# Patient Record
Sex: Female | Born: 1975 | Race: White | Marital: Single | Smoking: Current every day smoker
Health system: Southern US, Community
[De-identification: ages and names within clinical notes are randomized; demographics above are authoritative.]

## PROBLEM LIST (undated history)

## (undated) DIAGNOSIS — N83209 Unspecified ovarian cyst, unspecified side: Secondary | ICD-10-CM

## (undated) DIAGNOSIS — S0500XA Injury of conjunctiva and corneal abrasion without foreign body, unspecified eye, initial encounter: Secondary | ICD-10-CM

## (undated) DIAGNOSIS — I471 Supraventricular tachycardia: Secondary | ICD-10-CM

## (undated) DIAGNOSIS — E282 Polycystic ovarian syndrome: Secondary | ICD-10-CM

## (undated) DIAGNOSIS — I456 Pre-excitation syndrome: Secondary | ICD-10-CM

## (undated) DIAGNOSIS — E8881 Metabolic syndrome: Secondary | ICD-10-CM

## (undated) DIAGNOSIS — R87629 Unspecified abnormal cytological findings in specimens from vagina: Secondary | ICD-10-CM

## (undated) DIAGNOSIS — R87810 Cervical high risk human papillomavirus (HPV) DNA test positive: Principal | ICD-10-CM

## (undated) DIAGNOSIS — H3560 Retinal hemorrhage, unspecified eye: Secondary | ICD-10-CM

## (undated) HISTORY — PX: TONSILLECTOMY: SUR1361

## (undated) HISTORY — PX: DILATION AND CURETTAGE OF UTERUS: SHX78

## (undated) HISTORY — DX: Supraventricular tachycardia: I47.1

## (undated) HISTORY — DX: Cervical high risk human papillomavirus (HPV) DNA test positive: R87.810

## (undated) HISTORY — DX: Injury of conjunctiva and corneal abrasion without foreign body, unspecified eye, initial encounter: S05.00XA

## (undated) HISTORY — PX: LAPAROSCOPY: SHX197

## (undated) HISTORY — PX: RETINAL DETACHMENT SURGERY: SHX105

## (undated) HISTORY — DX: Metabolic syndrome: E88.81

## (undated) HISTORY — DX: Retinal hemorrhage, unspecified eye: H35.60

## (undated) HISTORY — DX: Polycystic ovarian syndrome: E28.2

## (undated) HISTORY — DX: Unspecified abnormal cytological findings in specimens from vagina: R87.629

## (undated) HISTORY — DX: Pre-excitation syndrome: I45.6

## (undated) HISTORY — PX: COLPOSCOPY: SHX161

## (undated) HISTORY — PX: KNEE ARTHROSCOPY: SUR90

---

## 1998-11-11 DIAGNOSIS — I456 Pre-excitation syndrome: Secondary | ICD-10-CM

## 1998-11-11 HISTORY — DX: Pre-excitation syndrome: I45.6

## 2011-05-30 ENCOUNTER — Encounter: Payer: Self-pay | Admitting: Emergency Medicine

## 2011-05-30 ENCOUNTER — Emergency Department (HOSPITAL_COMMUNITY): Payer: Self-pay

## 2011-05-30 ENCOUNTER — Emergency Department (HOSPITAL_COMMUNITY)
Admission: EM | Admit: 2011-05-30 | Discharge: 2011-05-31 | Disposition: A | Discharge status: Home / Self Care | Payer: Self-pay | Attending: Emergency Medicine | Admitting: Emergency Medicine

## 2011-05-30 DIAGNOSIS — F17200 Nicotine dependence, unspecified, uncomplicated: Secondary | ICD-10-CM | POA: Insufficient documentation

## 2011-05-30 DIAGNOSIS — O200 Threatened abortion: Secondary | ICD-10-CM | POA: Insufficient documentation

## 2011-05-30 HISTORY — DX: Unspecified ovarian cyst, unspecified side: N83.209

## 2011-05-30 LAB — CBC
Hemoglobin: 12.6 g/dL (ref 12.0–15.0)
MCHC: 34.8 g/dL (ref 30.0–36.0)
WBC: 6.2 10*3/uL (ref 4.0–10.5)

## 2011-05-30 LAB — DIFFERENTIAL
Basophils Absolute: 0 10*3/uL (ref 0.0–0.1)
Basophils Relative: 0 % (ref 0–1)
Monocytes Relative: 6 % (ref 3–12)
Neutro Abs: 3.9 10*3/uL (ref 1.7–7.7)
Neutrophils Relative %: 63 % (ref 43–77)

## 2011-05-30 LAB — URINE MICROSCOPIC-ADD ON

## 2011-05-30 LAB — BASIC METABOLIC PANEL
BUN: 6 mg/dL (ref 6–23)
Chloride: 104 mEq/L (ref 96–112)
GFR calc Af Amer: >60 mL/min (ref >60)
Potassium: 3.6 mEq/L (ref 3.5–5.1)

## 2011-05-30 LAB — URINALYSIS, ROUTINE W REFLEX MICROSCOPIC
Glucose, UA: NEGATIVE mg/dL
Leukocytes, UA: NEGATIVE
Specific Gravity, Urine: >1.03 — ABNORMAL HIGH (ref 1.005–1.030)
pH: 5.5 (ref 5.0–8.0)

## 2011-05-30 LAB — PREGNANCY, URINE: Preg Test, Ur: POSITIVE

## 2011-05-30 LAB — ABO/RH: ABO/RH(D): A POS

## 2011-05-30 MED ORDER — MORPHINE SULFATE 10 MG/ML IJ SOLN
4.0000 mg | Freq: Once | INTRAMUSCULAR | Status: AC
  Administered 2011-05-30: 4 mg via INTRAMUSCULAR
  Filled 2011-05-30: qty 1

## 2011-05-30 MED ORDER — ONDANSETRON 8 MG PO TBDP
8.0000 mg | ORAL_TABLET | Freq: Once | ORAL | Status: AC
  Administered 2011-05-30: 8 mg via ORAL
  Filled 2011-05-30: qty 1

## 2011-05-30 NOTE — ED Provider Notes (Signed)
History     Chief Complaint  Patient presents with  . Vaginal Bleeding  . Abdominal Pain   HPI  Past Medical History  Diagnosis Date  . Endometriosis   . Other and unspecified ovarian cysts     Past Surgical History  Procedure Date  . Tonsillectomy   . Dilation and curettage of uterus   . Knee arthroscopy     History reviewed. No pertinent family history.  History  Substance Use Topics  . Smoking status: Current Everyday Smoker -- 1.0 packs/day  . Smokeless tobacco: Not on file  . Alcohol Use: 1.8 oz/week    3 Cans of beer per week    OB History    Grav Para Term Preterm Abortions TAB SAB Ect Mult Living   4 0              Review of Systems  Physical Exam  BP 107/53  Pulse 66  Temp(Src) 98.2 F (36.8 C) (Oral)  Resp 16  Ht 5\' 10"  (1.778 m)  Wt 165 lb (74.844 kg)  BMI 23.68 kg/m2  SpO2 99%  LMP 05/28/2011  Physical Exam  ED Course  Procedures  Medical screening examination/treatment/procedure(s) were performed by non-physician practitioner and as supervising physician I was immediately available for consultation/collaboration.       Nelia Shi, MD 05/30/11 [phone]

## 2011-05-30 NOTE — ED Notes (Signed)
Pt states she took a pregnancy test about one month ago and it was positive. Pt states she started bleeding Tuesday night and started cramping last night.

## 2011-05-30 NOTE — ED Provider Notes (Signed)
History     Chief Complaint  Patient presents with  . Vaginal Bleeding  . Abdominal Pain   HPI Comments: Patient c/o persistent vaginal bleeding for two days and pelvic cramping that began last evening.  States that she took several home pregnancy tests 2 weeks ago and all have been positive.  States that she is concerned that she is having a miscarriage.  States she has a hx of endometriosis and previous miscarriages that have had similar symptoms.  She denies previous ectopic pregnancies, fever, vomiting or chest pain. States the bleeding tonight has been similar to her periods but with frequent clots.     Patient is a 35 y.o. female presenting with vaginal bleeding. The history is provided by the patient.  Vaginal Bleeding This is a new problem. The current episode started in the past 7 days. The problem occurs constantly. The problem has been unchanged. Associated symptoms include abdominal pain and nausea. Pertinent negatives include no change in bowel habit, chest pain, fatigue, fever, headaches, neck pain, numbness, sore throat, urinary symptoms, vomiting or weakness. The symptoms are aggravated by nothing. She has tried nothing for the symptoms. The treatment provided no relief.    Past Medical History  Diagnosis Date  . Endometriosis   . Other and unspecified ovarian cysts     Past Surgical History  Procedure Date  . Tonsillectomy   . Dilation and curettage of uterus   . Knee arthroscopy     History reviewed. No pertinent family history.  History  Substance Use Topics  . Smoking status: Current Everyday Smoker -- 1.0 packs/day  . Smokeless tobacco: Not on file  . Alcohol Use: 1.8 oz/week    3 Cans of beer per week    OB History    Grav Para Term Preterm Abortions TAB SAB Ect Mult Living   4 0              Review of Systems  Constitutional: Negative for fever, activity change, appetite change and fatigue.  HENT: Negative for sore throat, neck pain and neck  stiffness.   Respiratory: Negative.   Cardiovascular: Negative.  Negative for chest pain.  Gastrointestinal: Positive for nausea and abdominal pain. Negative for vomiting, diarrhea, constipation, abdominal distention and change in bowel habit.  Genitourinary: Positive for vaginal bleeding, menstrual problem and pelvic pain. Negative for dysuria, flank pain and vaginal discharge.  Musculoskeletal: Negative.   Skin: Negative.   Neurological: Negative for weakness, numbness and headaches.  Hematological: Does not bruise/bleed easily.  Psychiatric/Behavioral: Negative for behavioral problems.    Physical Exam  BP 111/59  Pulse 64  Temp(Src) 98.2 F (36.8 C) (Oral)  Resp 20  Ht 5\' 10"  (1.778 m)  Wt 165 lb (74.844 kg)  BMI 23.68 kg/m2  SpO2 99%  LMP 05/28/2011  Physical Exam  Constitutional: She is oriented to person, place, and time. She appears well-developed and well-nourished.  Non-toxic appearance. No distress.  HENT:  Head: Normocephalic and atraumatic.  Eyes: Pupils are equal, round, and reactive to light.  Neck: Normal range of motion. Neck supple.  Cardiovascular: Normal rate, regular rhythm and normal heart sounds.   Pulmonary/Chest: Effort normal and breath sounds normal.  Abdominal: Soft. She exhibits no mass. There is tenderness. There is no rebound and no guarding.  Genitourinary: Vagina normal. Uterus is tender. Uterus is not enlarged. Right adnexum displays no mass and no tenderness. Left adnexum displays no mass and no tenderness. No vaginal discharge found.  Cervix is closed,  Blood present in the canal with few small clots.  Uterus is not enlarged.  Musculoskeletal: Normal range of motion.  Neurological: She is alert and oriented to person, place, and time. She exhibits normal muscle tone. Coordination normal.  Skin: Skin is warm and dry.    ED Course  Procedures  MDM   2320  Patient back from [place].  Vitals remain stable.  Likely fetal demise.  I have  discussed pt hx, results and care plan with EDP and discussed results with the pt.  Has hx of previous miscarriages, endometriosis.  I have recommended close f/u with Dr. Despina Hidden or [facility] for serial quant. HCG levels and repeat US in 7-10 days.  Patient verbalized understanding of her results and importance of close follow-up.  She was also advised to return here if needed.  Results for orders placed during the hospital encounter of 05/30/11  URINALYSIS, ROUTINE W REFLEX MICROSCOPIC      Component Value Range   Color, Urine AMBER (*) YELLOW    Appearance CLEAR  CLEAR    Specific Gravity, Urine >1.030 (*) 1.005 - 1.030    pH 5.5  5.0 - 8.0    Glucose, UA NEGATIVE  NEGATIVE (mg/dL)   Hgb urine dipstick LARGE (*) NEGATIVE    Bilirubin Urine SMALL (*) NEGATIVE    Ketones, ur NEGATIVE  NEGATIVE (mg/dL)   Protein, ur TRACE (*) NEGATIVE (mg/dL)   Urobilinogen, UA 0.2  0.0 - 1.0 (mg/dL)   Nitrite NEGATIVE  NEGATIVE    Leukocytes, UA NEGATIVE  NEGATIVE   PREGNANCY, URINE      Component Value Range   Preg Test, Ur POSITIVE    CBC      Component Value Range   WBC 6.2  4.0 - 10.5 (K/uL)   RBC 3.99  3.87 - 5.11 (MIL/uL)   Hemoglobin 12.6  12.0 - 15.0 (g/dL)   HCT 21.3  08.6 - 57.8 (%)   MCV 90.7  78.0 - 100.0 (fL)   MCH 31.6  26.0 - 34.0 (pg)   MCHC 34.8  30.0 - 36.0 (g/dL)   RDW 46.9  62.9 - 52.8 (%)   Platelets 220  150 - 400 (K/uL)  DIFFERENTIAL      Component Value Range   Neutrophils Relative 63  43 - 77 (%)   Neutro Abs 3.9  1.7 - 7.7 (K/uL)   Lymphocytes Relative 29  12 - 46 (%)   Lymphs Abs 1.8  0.7 - 4.0 (K/uL)   Monocytes Relative 6  3 - 12 (%)   Monocytes Absolute 0.4  0.1 - 1.0 (K/uL)   Eosinophils Relative 1  0 - 5 (%)   Eosinophils Absolute 0.1  0.0 - 0.7 (K/uL)   Basophils Relative 0  0 - 1 (%)   Basophils Absolute 0.0  0.0 - 0.1 (K/uL)  BASIC METABOLIC PANEL      Component Value Range   Sodium 138  135 - 145 (mEq/L)   Potassium 3.6  3.5 - 5.1 (mEq/L)    Chloride 104  96 - 112 (mEq/L)   CO2 24  19 - 32 (mEq/L)   Glucose, Bld 129 (*) 70 - 99 (mg/dL)   BUN 6  6 - 23 (mg/dL)   Creatinine, Ser 4.13  0.50 - 1.10 (mg/dL)   Calcium 9.5  8.4 - 24.4 (mg/dL)   GFR calc non Af Amer >60  >60 (mL/min)   GFR calc Af Amer >60  >60 (mL/min)  URINE MICROSCOPIC-ADD ON      Component Value Range   Squamous Epithelial / LPF FEW (*) RARE    WBC, UA 0-2  <3 (WBC/hpf)   RBC / HPF 11-20  <3 (RBC/hpf)   Bacteria, UA MANY (*) RARE   HCG, QUANTITATIVE, PREGNANCY      Component Value Range   hCG, Beta Chain, Quant, S 3585 (*) <5 (mIU/mL)  WET PREP, GENITAL      Component Value Range   Yeast, Wet Prep NONE SEEN  NONE SEEN    Trich, Wet Prep NONE SEEN  NONE SEEN    Clue Cells, Wet Prep FEW (*) NONE SEEN    WBC, Wet Prep HPF POC FEW (*) NONE SEEN   ABO/RH      Component Value Range   ABO/RH(D) A POS      US Ob Comp Less 14 Wks  05/30/2011  ADDENDUM CREATED: 05/30/2011 23:17:56  Discussion with Navon Kotowski Triplet 05/30/2011 11:20 p.m. with regard to follow-up.   END ADDENDUM  SIGNED BY: Almedia Balls. Constance Goltz, M.D.   05/30/2011 RADIOLOGY REPORT  Clinical Data: Pelvic pain.  OBSTETRIC <14 WK [place] AND TRANSVAGINAL OB US  Technique:  Both transabdominal and transvaginal ultrasound examinations were performed for complete evaluation of the gestation as well as the maternal uterus, adnexal regions, and pelvic cul-de-sac.  Transvaginal technique was performed to assess early pregnancy.  Comparison:  None.  Gestational sac is elongated.  Mean sac diameter at 2.49 cm suggestive of 7-week-4-day gestational age with estimated date of confinement of 01/12/2012.  Within the elongated gestational sac is an abnormal appearing structure which does not have the typical appearance for a yolk sac and normal embryo.  No heart rate is noted.  Maternal uterus/adnexae: Trace amount of fluid in the cervix.  IMPRESSION: Abnormal appearance of the contents of the gestational sac.  Favor correlation with  clinical exam, serial beta HCGs and follow-up sonogram in 10 days.  Original Report Authenticated By: Fuller Canada, M.D.   US Transvaginal Non-ob  05/30/2011  ADDENDUM  CREATED: 05/30/2011 23:17:56  Discussion with Denaya Horn Triplet 05/30/2011 11:20 p.m. with regard to follow-up.  END ADDENDUM  SIGNED BY: Almedia Balls. Constance Goltz, M.D.   05/30/2011  RADIOLOGY REPORT  Clinical Data: Pelvic pain.  OBSTETRIC <14 WK [place] AND TRANSVAGINAL OB US  Technique:  Both transabdominal and transvaginal ultrasound examinations were performed for complete evaluation of the gestation as well as the maternal uterus, adnexal regions, and pelvic cul-de-sac.  Transvaginal technique was performed to assess early pregnancy.  Comparison:  None.  Gestational sac is elongated.  Mean sac diameter at 2.49 cm suggestive of 7-week-4-day gestational age with estimated date of confinement of 01/12/2012.  Within the elongated gestational sac is an abnormal appearing structure which does not have the typical appearance for a yolk sac and normal embryo.  No heart rate is noted.  Maternal uterus/adnexae: Trace amount of fluid in the cervix.  IMPRESSION: Abnormal appearance of the contents of the gestational sac.  Favor correlation with clinical exam, serial beta HCGs and follow-up sonogram in 10 days.  Original Report Authenticated By: Fuller Canada, M.D.        Jarrell Armond L. Robertson, [place] 05/31/11 [phone]

## 2011-05-31 MED ORDER — HYDROCODONE-ACETAMINOPHEN 5-325 MG PO TABS: ORAL_TABLET | ORAL | Status: AC

## 2011-05-31 NOTE — ED Notes (Signed)
Patient is calm just relaxing waiting for discharge.

## 2011-06-01 LAB — GC/CHLAMYDIA PROBE AMP, GENITAL: Chlamydia, DNA Probe: NEGATIVE

## 2011-06-09 NOTE — ED Provider Notes (Signed)
Medical screening examination/treatment/procedure(s) were performed by non-physician practitioner and as supervising physician I was immediately available for consultation/collaboration.   Nelia Shi, MD 06/09/11 [phone]

## 2014-09-12 ENCOUNTER — Encounter: Payer: Self-pay | Admitting: Emergency Medicine

## 2016-11-07 ENCOUNTER — Ambulatory Visit (INDEPENDENT_AMBULATORY_CARE_PROVIDER_SITE_OTHER): Payer: Self-pay | Admitting: Internal Medicine

## 2016-11-07 ENCOUNTER — Encounter: Payer: Self-pay | Admitting: Internal Medicine

## 2016-11-07 VITALS — BP 106/62 | HR 69 | Ht 68.0 in | Wt 149.0 lb

## 2016-11-07 DIAGNOSIS — R000 Tachycardia, unspecified: Secondary | ICD-10-CM

## 2016-11-07 DIAGNOSIS — I456 Pre-excitation syndrome: Secondary | ICD-10-CM

## 2016-11-07 DIAGNOSIS — R55 Syncope and collapse: Secondary | ICD-10-CM

## 2016-11-07 NOTE — Patient Instructions (Signed)
Your physician recommends that you schedule a follow-up appointment To be determined  Your physician recommends that you continue on your current medications as directed. Please refer to the Current Medication list given to you today.  Your physician has recommended that you wear an 30 Day event monitor. Event monitors are medical devices that record the heart's electrical activity. Doctors most often us these monitors to diagnose arrhythmias. Arrhythmias are problems with the speed or rhythm of the heartbeat. The monitor is a small, portable device. You can wear one while you do your normal daily activities. This is usually used to diagnose what is causing palpitations/syncope (passing out).  If you need a refill on your cardiac medications before your next appointment, please call your pharmacy.  Thank you for choosing [facility]!

## 2016-11-07 NOTE — Progress Notes (Signed)
Cardiology Office Note   Date:  11/07/2016   ID:  Laura Morales Kok, DOB [birth year], MRN [id number]  PCP:  Wilson SingerGOSRANI,NIMISH C, MD  Cardiologist:   Dietrich PatesPaula Derec Mozingo, MD    Referred for syncope  ? WPW     History of Present Illness: Laura Morales Duty is a 40 y.o. female with a history of Pt is seen by [place].  Pt was sitting at home  Finished morning classes  Was around 3 PM  Had oatmal and 2 granola bars at [employer]  Taught classes  Feels like sugar wsa low  Felt heart racing some but was excited about package      Then passed out   Sudden LOC  Lasted about 20 min  When woke L arm wa numb and shoulder hurt and L face hurt     Confusion after for 3 days  Unable to veralized what wanted to say  Dizziness with movement  WOrld spinning   Aoso CP Dx with WPW at age 40    Now pain on L chest   Has skips on L chest often  Last about 16 beats then stops NOw have heart racing     Arm goes numb  Then pressure  In L neck  Nauseated   No syncope  Feels headache after       Current Meds  Medication Sig  . ibuprofen (ADVIL,MOTRIN) 200 MG tablet Take 200 mg by mouth every 6 (six) hours as needed.       Allergies:   Codeine   Past Medical History:  Diagnosis Date  . Endometriosis   . Other and unspecified ovarian cysts   . WPW (Wolff-Parkinson-White syndrome) 2000    Past Surgical History:  Procedure Laterality Date  . DILATION AND CURETTAGE OF UTERUS    . KNEE ARTHROSCOPY    . LAPAROSCOPY    . TONSILLECTOMY       Social History:  The patient  reports that she has been smoking Cigarettes.  She started smoking about 27 years ago. She has been smoking about 0.50 packs per day. She has quit using smokeless tobacco. She reports that she drinks about 1.8 oz of alcohol per week . She reports that she does not use drugs.   Family History:  The patient's family history includes Cystic fibrosis in her sister; Diabetes in her father; Epilepsy in her mother; Glaucoma in her brother and father;  Heart failure in her maternal grandmother; Hypotension in her mother; Multiple sclerosis in her sister; Seizures in her sister and sister.  Mother with tachycardia  Not sure what     ROS:  Please see the history of present illness. All other systems are reviewed and  Negative to the above problem except as noted.    PHYSICAL EXAM: VS:  BP 106/62 (BP Location: Right Arm)   Pulse 69   Ht 5\' 8"  (1.727 m)   Wt 149 lb (67.6 kg)   LMP 10/17/2016   SpO2 99%   BMI 22.66 kg/m   GEN: Well nourished, well developed, in no acute distress  HEENT: normal  Neck: no JVD, carotid bruits, or masses Cardiac: RRR; no murmurs, rubs, or gallops,no edema  Respiratory:  clear to auscultation bilaterally, normal work of breathing GI: soft, nontender, nondistended, + BS  No hepatomegaly  MS: no deformity Moving all extremities   Skin: warm and dry, no rash Neuro:  Strength and sensation are intact Psych: euthymic mood, full affect   EKG:  EKG is ordered  today.  SB 58 bpm  Short PR interval     Lipid Panel No results found for: CHOL, TRIG, HDL, CHOLHDL, VLDL, LDLCALC, LDLDIRECT    Wt Readings from Last 3 Encounters:  11/07/16 149 lb (67.6 kg)  05/30/11 165 lb (74.8 kg)      ASSESSMENT AND PLAN:  1  Syncope  I am not convinced syncopal spelll was related to an arrhythmia.  EKG with short PR but no definite preexcitation.   She does have spells of palpitations, some sound like short bursts of tachycardia or just skipped beats I have recomm 1.  Pt get set up for an event monitor  2.  Stay hydrated and eat when working out 3  No driving for 6 months unless identifiable cause found  The pt having L sided aches (chest , jaw, neck) and some numbness in hand  I think this most likely is muscular from falling back on L side     Current medicines are reviewed at length with the patient today.  The patient does not have concerns regarding medicines.  Signed, Dietrich PatesPaula Cynthie Garmon, MD  11/07/2016 8:58 AM      [facility] [address], [place], [place]  [address] Phone: [phone]; Fax: [phone]

## 2016-11-13 ENCOUNTER — Ambulatory Visit (INDEPENDENT_AMBULATORY_CARE_PROVIDER_SITE_OTHER): Payer: Self-pay

## 2016-11-13 DIAGNOSIS — R55 Syncope and collapse: Secondary | ICD-10-CM

## 2016-11-13 DIAGNOSIS — R000 Tachycardia, unspecified: Secondary | ICD-10-CM

## 2016-12-04 ENCOUNTER — Telehealth: Payer: Self-pay | Admitting: Cardiovascular Disease

## 2016-12-04 NOTE — Telephone Encounter (Signed)
Returned pt phone call, she complains of being light headed and dizzy for past several days. She has kept up with he bp ( 113/70, 106/65, 93/66, 121/62, 97/51)  She has felt her heart flutter and skip beats. She also stated that she is having left arm numbness. She has been drinking plenty of water and eating on a regular basis to try to keep her bp from dropping too low. Please advise.

## 2016-12-04 NOTE — Telephone Encounter (Signed)
Please call patient regarding BP readings / tg  °

## 2016-12-05 NOTE — Telephone Encounter (Signed)
[id number] 

## 2016-12-05 NOTE — Telephone Encounter (Signed)
Pt will come on 2/8 at 4 pm for orthostatics

## 2016-12-05 NOTE — Telephone Encounter (Signed)
Patient needs to increase fluid and salt. Please place on f/u for orthostatics again in a couple wks

## 2016-12-19 ENCOUNTER — Encounter: Payer: Self-pay | Admitting: Internal Medicine

## 2016-12-19 ENCOUNTER — Ambulatory Visit (INDEPENDENT_AMBULATORY_CARE_PROVIDER_SITE_OTHER): Payer: Self-pay

## 2016-12-19 VITALS — BP 121/66 | Ht 70.0 in | Wt 154.0 lb

## 2016-12-19 DIAGNOSIS — R42 Dizziness and giddiness: Secondary | ICD-10-CM

## 2016-12-19 NOTE — Progress Notes (Signed)
Pt came in and states that she is very tired, sob, dizziness, chest pain and tightness, lots of pressure and also feels like a pinching sensation in middle of chest( not at any particular time of day ) Pt having bad headaches also.

## 2016-12-19 NOTE — Patient Instructions (Addendum)
Your physician recommends that you continue on your current medications as directed. Please refer to the Current Medication list given to you today.     Your physician recommends that you schedule a follow-up appointment in: TO BE DETERMINED  Thanks for choosing [facility]!!!

## 2016-12-20 ENCOUNTER — Encounter: Payer: Self-pay | Admitting: Internal Medicine

## 2016-12-20 ENCOUNTER — Telehealth: Payer: Self-pay | Admitting: Internal Medicine

## 2016-12-20 NOTE — Telephone Encounter (Signed)
Patient is calling saying that someone named Lynden AngCathy called her and left a message for her to call back for results. It looks like a Marlyn Corporalatherine Carlton, RN from the [place] office called and left a message for her event monitor. Patient was made aware of results.   "Notes Recorded by Pricilla RifflePaula Ross V, MD on 12/20/2016 at 3:36 PM EST Sinus rhythm One short burst of SVT Sensed. Other symptoms however did not correlate with arrhythmia  Will review with EP Keep hydrated."  The patient is requesting an note for work. I told the patient that I would send the message to Dr. Tenny Crawoss and Marlyn Corporalatherine Carlton, RN.

## 2016-12-20 NOTE — Telephone Encounter (Signed)
New message      Needs her results and note for her to return to work to work on Monday ,

## 2016-12-23 NOTE — Telephone Encounter (Signed)
Seen 11/07/17 , will forward to Dr Tenny Crawoss regarding work slip

## 2016-12-24 NOTE — Telephone Encounter (Signed)
Please write for excuse for work until she is seen She should have appt soon for BP check (orthostatics)

## 2016-12-25 NOTE — Telephone Encounter (Signed)
Forwarded a note to MGM MIRAGEerry Goins to schedule an appointment with Joni ReiningKathryn Lawrence, as pt has questions that cannot be answered at a nurse visit.

## 2016-12-30 ENCOUNTER — Ambulatory Visit (INDEPENDENT_AMBULATORY_CARE_PROVIDER_SITE_OTHER): Payer: Self-pay | Admitting: Adult Health

## 2016-12-30 ENCOUNTER — Encounter: Payer: Self-pay | Admitting: Adult Health

## 2016-12-30 VITALS — BP 106/62 | HR 89 | Ht 70.0 in | Wt 154.0 lb

## 2016-12-30 DIAGNOSIS — I5189 Other ill-defined heart diseases: Secondary | ICD-10-CM

## 2016-12-30 DIAGNOSIS — R42 Dizziness and giddiness: Secondary | ICD-10-CM

## 2016-12-30 DIAGNOSIS — R000 Tachycardia, unspecified: Secondary | ICD-10-CM

## 2016-12-30 DIAGNOSIS — I519 Heart disease, unspecified: Secondary | ICD-10-CM

## 2016-12-30 NOTE — Progress Notes (Signed)
Name: Dinah Beerserri Hargadon    DOB: 09-18-1976  Age: 41 y.o.  MR#: [id number]       PCP:  Wilson SingerGOSRANI,NIMISH C, MD      Insurance: Payor: / No coverage found.  CC:   No chief complaint on file.   VS Vitals:   12/30/16 1324  BP: 106/62  Pulse: 89  SpO2: 98%  Weight: 154 lb (69.9 kg)  Height: 5\' 10"  (1.778 m)    Weights Current Weight  12/30/16 154 lb (69.9 kg)  12/19/16 154 lb (69.9 kg)  11/07/16 149 lb (67.6 kg)    Blood Pressure  BP Readings from Last 3 Encounters:  12/30/16 106/62  12/19/16 121/66  11/07/16 106/62     Admit date:  (Not on file) Last encounter with RMR:  Visit date not found   Allergy Codeine  No current outpatient prescriptions on file.   No current facility-administered medications for this visit.     Discontinued Meds:    Medications Discontinued During This Encounter  Medication Reason  . ibuprofen (ADVIL,MOTRIN) 200 MG tablet Error    There are no active problems to display for this patient.   LABS    Component Value Date/Time   NA 138 05/30/2011 2059   K 3.6 05/30/2011 2059   CL 104 05/30/2011 2059   CO2 24 05/30/2011 2059   GLUCOSE 129 (H) 05/30/2011 2059   BUN 6 05/30/2011 2059   CREATININE 0.57 05/30/2011 2059   CALCIUM 9.5 05/30/2011 2059   GFRNONAA >60 05/30/2011 2059   GFRAA >60 05/30/2011 2059   CMP     Component Value Date/Time   NA 138 05/30/2011 2059   K 3.6 05/30/2011 2059   CL 104 05/30/2011 2059   CO2 24 05/30/2011 2059   GLUCOSE 129 (H) 05/30/2011 2059   BUN 6 05/30/2011 2059   CREATININE 0.57 05/30/2011 2059   CALCIUM 9.5 05/30/2011 2059   GFRNONAA >60 05/30/2011 2059   GFRAA >60 05/30/2011 2059       Component Value Date/Time   WBC 6.2 05/30/2011 2059   HGB 12.6 05/30/2011 2059   HCT 36.2 05/30/2011 2059   MCV 90.7 05/30/2011 2059    Lipid Panel  No results found for: CHOL, TRIG, HDL, CHOLHDL, VLDL, LDLCALC, LDLDIRECT  ABG No results found for: PHART, PCO2ART, PO2ART, HCO3, TCO2, ACIDBASEDEF,  O2SAT   No results found for: TSH BNP (last 3 results) No results for input(s): BNP in the last 8760 hours.  ProBNP (last 3 results) No results for input(s): PROBNP in the last 8760 hours.  Cardiac Panel (last 3 results) No results for input(s): CKTOTAL, CKMB, TROPONINI, RELINDX in the last 72 hours.  Iron/TIBC/Ferritin/ %Sat No results found for: IRON, TIBC, FERRITIN, IRONPCTSAT   EKG Orders placed or performed in visit on 11/08/16  . EKG     Prior Assessment and Plan    Imaging: No results found.

## 2016-12-30 NOTE — Patient Instructions (Signed)
Medication Instructions:  NONE  Labwork: Your physician recommends that you return for lab work in: ASAP BMET CBC TSH MAGNESIUM   Testing/Procedures: Your physician has requested that you have an echocardiogram. Echocardiography is a painless test that uses sound waves to create images of your heart. It provides your doctor with information about the size and shape of your heart and how well your heart's chambers and valves are working. This procedure takes approximately one hour. There are no restrictions for this procedure.    Follow-Up: Your physician recommends that you schedule a follow-up appointment in: 2 WEEKS    Any Other Special Instructions Will Be Listed Below (If Applicable).     If you need a refill on your cardiac medications before your next appointment, please call your pharmacy.

## 2016-12-30 NOTE — Progress Notes (Signed)
Cardiology Office Note   Date:  12/30/2016   ID:  Laura Morales, DOB 11-08-1976, MRN [id number]  PCP:  Wilson SingerGOSRANI,NIMISH C, MD  Cardiologist: Ross/  Joni ReiningKathryn Nollie Terlizzi, NP   Chief Complaint  Patient presents with  . Dizziness  . Tachycardia      History of Present Illness: Laura Beerserri Toto is a 41 y.o. female who presents for ongoing assessment and management of syncopal episodes, prior history of WPW, he was last seen by Dr. Tenny Crawoss on 11/07/2016. She was placed on an 30 day event monitor, and was asked not to drive unless identifiable cause had been found for her syncopal episodes. Event monitor completed on 11/29/2016 revealed sinus rhythm with occasional PVCs, one short burst of PSVT.  She is here with multiple complaints to include severe dizziness, rapid heart rhythm, fatigue, inability to perform ADLs due to severe headaches and dizziness . 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 She is unable to do the proposes an year ago without feeling as if her heart is racing too fast or she becomes near syncopal.  She states that she can also feel her heart racing at rest, and not necessarily with physical activity. She eats healthy, has one cup of coffee a day, and one son dropped that she sits on throughout the day. Other than that she drinks water. Other history includes endometriosis with severe pain and bleeding during menses. She has not been followed by OB/GYN in over 2 years.   Past Medical History:  Diagnosis Date  . Endometriosis   . Other and unspecified ovarian cysts   . WPW (Wolff-Parkinson-White syndrome) 2000    Past Surgical History:  Procedure Laterality Date  . DILATION AND CURETTAGE OF UTERUS    . KNEE ARTHROSCOPY    . LAPAROSCOPY    . TONSILLECTOMY       No current outpatient prescriptions on file.   No current facility-administered medications for this visit.      Allergies:   Codeine    Social History:  The patient  reports that she has been smoking Cigarettes.  She started smoking about 27 years ago. She has been smoking about 0.50 packs per day. She has quit using smokeless tobacco. She reports that she drinks about 1.8 oz of alcohol per week . She reports that she does not use drugs.   Family History:  The patient's family history includes Cystic fibrosis in her sister; Diabetes in her father; Epilepsy in her mother; Glaucoma in her brother and father; Heart failure in her maternal grandmother; Hypotension in her mother; Multiple sclerosis in her sister; Seizures in her sister and sister.    ROS: All other systems are reviewed and negative. Unless otherwise mentioned in H&P    PHYSICAL EXAM: VS:  BP 106/62   Pulse 89   Ht 5\' 10"  (1.778 m)   Wt 154 lb (69.9 kg)   SpO2 98%   BMI 22.10 kg/m  , BMI Body mass index is 22.1 kg/m. GEN: Well nourished, well developed, in no acute distress  HEENT: normal  Neck: no JVD, carotid bruits, or masses Cardiac: RRR; no murmurs, rubs, or gallops,no edema  Respiratory:  clear to auscultation bilaterally, normal work of breathing GI: soft, nontender, nondistended, + BS MS: no deformity or atrophy  Skin: warm and dry, no rash. Facial acne. Neuro:  Strength and sensation are intact Psych: euthymic mood, full affect  EKG:  Normal sinus rhythm with short PR interval, 108  ms, with occasional PAC  Recent Labs: No results found for requested labs within last 8760 hours.    Lipid Panel   Wt Readings from Last 3 Encounters:  12/30/16 154 lb (69.9 kg)  12/19/16 154 lb (69.9 kg)  11/07/16 149 lb (67.6 kg)      Other studies Reviewed: I have reviewed labs completed at [facility] 10/29/2016. which includes CBC, BMET, and a TSH. All of which were within normal limits.  ASSESSMENT AND PLAN:  1.  Rapid heart rhythm: Self-reported with associated dizziness and weakness. She did have a 30 day  cardiac monitor when first evaluated by Dr. Tenny Craw with results above. She did have one first of PSVT. She was not placed on beta blocker at that time, due to hypotension.  The patient will have an echocardiogram to evaluate for structural heart disease. I will repeat labs to include magnesium and TSH CBC and BMET. Consider EP evaluation if testing does not reveal abnormalities. She does not notice these symptoms with position change, and therefore POTS is not suspected. .   I have asked her to stop all caffeine, stay hydrated, and we will discuss test results on follow-up appointment.  2. Hypotension:  Blood pressure soft today. She is not on any antihypertensive medications. I advised her to increase her fluid intake avoid caffeine which can be dehydrating. Labs are ordered.   Current medicines are reviewed at length with the patient today.    Labs/ tests ordered today include:   Orders Placed This Encounter  Procedures  . TSH  . Basic Metabolic Panel (BMET)  . Magnesium  . CBC with Differential  . EKG 12-Lead  . ECHOCARDIOGRAM COMPLETE     Disposition:   FU with one month. Signed, Joni Reining, NP  12/30/2016 4:35 PM    [facility] [address], [place], [place] [address] Phone: [phone]; Fax: [phone]

## 2016-12-31 ENCOUNTER — Ambulatory Visit (HOSPITAL_COMMUNITY)
Admission: RE | Admit: 2016-12-31 | Discharge: 2016-12-31 | Disposition: A | Discharge status: Home / Self Care | Payer: Self-pay | Source: Ambulatory Visit | Attending: Adult Health | Admitting: Adult Health

## 2016-12-31 DIAGNOSIS — I519 Heart disease, unspecified: Secondary | ICD-10-CM

## 2016-12-31 DIAGNOSIS — I340 Nonrheumatic mitral (valve) insufficiency: Secondary | ICD-10-CM | POA: Insufficient documentation

## 2016-12-31 DIAGNOSIS — R42 Dizziness and giddiness: Secondary | ICD-10-CM | POA: Insufficient documentation

## 2016-12-31 DIAGNOSIS — I456 Pre-excitation syndrome: Secondary | ICD-10-CM | POA: Insufficient documentation

## 2016-12-31 DIAGNOSIS — I071 Rheumatic tricuspid insufficiency: Secondary | ICD-10-CM | POA: Insufficient documentation

## 2016-12-31 DIAGNOSIS — I5189 Other ill-defined heart diseases: Secondary | ICD-10-CM

## 2016-12-31 LAB — CBC WITH DIFFERENTIAL/PLATELET
BASOS ABS: 0 {cells}/uL (ref 0–200)
Basophils Relative: 0 %
EOS ABS: 78 {cells}/uL (ref 15–500)
Eosinophils Relative: 1 %
HEMATOCRIT: 40.2 % (ref 35.0–45.0)
Hemoglobin: 13.2 g/dL (ref 11.7–15.5)
LYMPHS PCT: 23 %
Lymphs Abs: 1794 cells/uL (ref 850–3900)
MCH: 30.1 pg (ref 27.0–33.0)
MCHC: 32.8 g/dL (ref 32.0–36.0)
MCV: 91.8 fL (ref 80.0–100.0)
MONO ABS: 468 {cells}/uL (ref 200–950)
MONOS PCT: 6 %
MPV: 10.7 fL (ref 7.5–12.5)
NEUTROS PCT: 70 %
Neutro Abs: 5460 cells/uL (ref 1500–7800)
PLATELETS: 247 10*3/uL (ref 140–400)
RBC: 4.38 MIL/uL (ref 3.80–5.10)
RDW: 12.8 % (ref 11.0–15.0)
WBC: 7.8 10*3/uL (ref 3.8–10.8)

## 2016-12-31 LAB — ECHOCARDIOGRAM COMPLETE
CHL CUP MV DEC (S): 289
E decel time: 289 msec
EERAT: 5.08
FS: 47 % — AB (ref 28–44)
IV/PV OW: 1.12
LA diam index: 1.5 cm/m2
LA vol A4C: 23.7 ml
LASIZE: 28 mm
LAVOL: 35.2 mL
LAVOLIN: 18.8 mL/m2
LEFT ATRIUM END SYS DIAM: 28 mm
LV PW d: 8.03 mm — AB (ref 0.6–1.1)
LV e' LATERAL: 17.2 cm/s
LVEEAVG: 5.08
LVEEMED: 5.08
MV Peak grad: 3 mmHg
MVPKAVEL: 58.2 m/s
MVPKEVEL: 87.3 m/s
TAPSE: 21.2 mm
TDI e' lateral: 17.2
TDI e' medial: 10.6
VTI: 160 cm

## 2016-12-31 LAB — BASIC METABOLIC PANEL
BUN: 9 mg/dL (ref 7–25)
CALCIUM: 9.5 mg/dL (ref 8.6–10.2)
CHLORIDE: 104 mmol/L (ref 98–110)
CO2: 28 mmol/L (ref 20–31)
Creat: 0.77 mg/dL (ref 0.50–1.10)
Glucose, Bld: 82 mg/dL (ref 65–99)
Potassium: 4.1 mmol/L (ref 3.5–5.3)
Sodium: 138 mmol/L (ref 135–146)

## 2016-12-31 LAB — TSH: TSH: 1.41 m[IU]/L

## 2016-12-31 LAB — MAGNESIUM: Magnesium: 2.1 mg/dL (ref 1.5–2.5)

## 2016-12-31 NOTE — Progress Notes (Signed)
*  PRELIMINARY RESULTS* Echocardiogram 2D Echocardiogram has been performed.  Jeryl Columbialliott, Josceline Chenard 12/31/2016, 11:01 AM

## 2017-01-01 ENCOUNTER — Telehealth: Payer: Self-pay | Admitting: *Deleted

## 2017-01-01 NOTE — Telephone Encounter (Signed)
-----   Message from Jodelle GrossKathryn M Lawrence, NP sent at 12/31/2016 12:03 PM EST ----- No structural defects or abnormalities are found on echocardiogram to explain her symptoms of dizziness and rapid HR.  No further cardiac testing is necessary. All labs are normal. Suggest follow up with PCP. Do not need to send to EP at this time. She has appointment with neurologist. Keep that.

## 2017-01-01 NOTE — Telephone Encounter (Signed)
Patient returned call

## 2017-01-01 NOTE — Telephone Encounter (Signed)
Called patient with test results. No answer. Unable to leave msg.  

## 2017-01-01 NOTE — Telephone Encounter (Signed)
Spoke to pt. Explained test results. She voiced understanding. She stated she will see a PCP, and neurologist.

## 2017-01-12 NOTE — Progress Notes (Signed)
Cardiology Office Note   Date:  01/13/2017   ID:  Laura Morales, DOB 1976-01-14, MRN [id number]  PCP:  Wilson Singer, MD  Cardiologist: Ross/ Joni Reining, NP   No chief complaint on file.     History of Present Illness: Laura Morales is a 41 y.o. female who presents for Laura Morales is a 41 y.o. female who presents for ongoing assessment and management of syncopal episodes, prior history of WPW, he was last seen by Dr. Tenny Craw on 11/07/2016. She was placed on an 30 day event monitor, and was asked not to drive unless identifiable cause had been found for her syncopal episodes. Event monitor completed on 11/29/2016 revealed sinus rhythm with occasional PVCs, one short burst of PSVT. No evidence of WPW on EKG.   Last seen in the office with multiple complaints. Echocardiogram was ordered, TSH, CBC, BMET. Labs were WNL.   Echocardiogram: 12/31/2016 Left ventricle: The cavity size was normal. Wall thickness was   normal. Systolic function was normal. The estimated ejection   fraction was in the range of 60% to 65%. Wall motion was normal;   there were no regional wall motion abnormalities. Left   ventricular diastolic function parameters were normal. - Mitral valve: There was mild regurgitation.  She comes today with continued complaint of dizziness and palpitations. I have reviewed all of her test results with her.   Past Medical History:  Diagnosis Date  . Endometriosis   . Other and unspecified ovarian cysts   . WPW (Wolff-Parkinson-White syndrome) 2000    Past Surgical History:  Procedure Laterality Date  . DILATION AND CURETTAGE OF UTERUS    . KNEE ARTHROSCOPY    . LAPAROSCOPY    . TONSILLECTOMY       No current outpatient prescriptions on file.   No current facility-administered medications for this visit.     Allergies:   Codeine    Social History:  The patient  reports that she has been smoking Cigarettes.  She started smoking about 27 years ago.  She has been smoking about 0.50 packs per day. She has quit using smokeless tobacco. She reports that she drinks about 1.8 oz of alcohol per week . She reports that she does not use drugs.   Family History:  The patient's family history includes Cystic fibrosis in her sister; Diabetes in her father; Epilepsy in her mother; Glaucoma in her brother and father; Heart failure in her maternal grandmother; Hypotension in her mother; Multiple sclerosis in her sister; Seizures in her sister and sister.    ROS: All other systems are reviewed and negative. Unless otherwise mentioned in H&P    PHYSICAL EXAM: VS:  BP 120/64   Pulse 70   Ht 5\' 10"  (1.778 m)   Wt 155 lb (70.3 kg)   LMP 01/08/2017   SpO2 98%   BMI 22.24 kg/m  , BMI Body mass index is 22.24 kg/m. GEN: Well nourished, well developed, in no acute distress  HEENT: normal  Neck: no JVD, carotid bruits, or masses Cardiac: RRR; no murmurs, rubs, or gallops,no edema  Respiratory:  clear to auscultation bilaterally, normal work of breathing GI: soft, nontender, nondistended, + BS MS: no deformity or atrophy  Skin: warm and dry, no rash Multiple tattoos. Facial acne. Neuro:  Strength and sensation are intact Psych: euthymic mood, full affect   Recent Labs: 12/30/2016: BUN 9; Creat 0.77; Hemoglobin 13.2; Magnesium 2.1; Platelets 247; Potassium 4.1; Sodium 138; TSH 1.41    Lipid Panel No  results found for: CHOL, TRIG, HDL, CHOLHDL, VLDL, LDLCALC, LDLDIRECT    Wt Readings from Last 3 Encounters:  01/13/17 155 lb (70.3 kg)  12/30/16 154 lb (69.9 kg)  12/19/16 154 lb (69.9 kg)      Other studies Reviewed  ASSESSMENT AND PLAN:  1.  Chronic Dizziness:  No evidence of structural heart disease on echocardiogram. Labs were WNL No evidence of anemia, renal insufficiency, or thyroid disease. BP is stable.   2. Tachycardia: HR is well controlled. Event monitor completed on 11/29/2016 revealed sinus rhythm with occasional PVCs, one short  burst of PSVT. She is not on BB at this time as she has remained in NSR on each office visit.  BP is stable. No further cardiac testing is planned. She will follow up with Dr. Tenny Crawoss in one year unless symptoms worsen or become more persistent. Can consider a loop recorder. No plans to send to EP at this time.    Current medicines are reviewed at length with the patient today.    Labs/ tests ordered today include:  No orders of the defined types were placed in this encounter.    Disposition:   FU with Dr. Tenny Crawoss, one year.  Signed, Joni ReiningKathryn Akiem Urieta, NP  01/13/2017 4:29 PM    [facility] [address], [place], [place] [address] Phone: [phone]) [phone]; Fax: [phone]

## 2017-01-13 ENCOUNTER — Ambulatory Visit (INDEPENDENT_AMBULATORY_CARE_PROVIDER_SITE_OTHER): Payer: Self-pay | Admitting: Adult Health

## 2017-01-13 ENCOUNTER — Encounter: Payer: Self-pay | Admitting: Adult Health

## 2017-01-13 VITALS — BP 120/64 | HR 70 | Ht 70.0 in | Wt 155.0 lb

## 2017-01-13 DIAGNOSIS — R000 Tachycardia, unspecified: Secondary | ICD-10-CM

## 2017-01-13 DIAGNOSIS — R42 Dizziness and giddiness: Secondary | ICD-10-CM

## 2017-01-13 NOTE — Progress Notes (Signed)
Name: Laura Morales    DOB: 28-Mar-1976  Age: 41 y.o.  MR#: [id number]       PCP:  Wilson SingerGOSRANI,NIMISH C, MD      Insurance: Payor: / No coverage found.  CC:   No chief complaint on file.   VS Vitals:   01/13/17 1459  BP: 120/64  Pulse: 70  SpO2: 98%  Weight: 155 lb (70.3 kg)  Height: 5\' 10"  (1.778 m)    Weights Current Weight  01/13/17 155 lb (70.3 kg)  12/30/16 154 lb (69.9 kg)  12/19/16 154 lb (69.9 kg)    Blood Pressure  BP Readings from Last 3 Encounters:  01/13/17 120/64  12/30/16 106/62  12/19/16 121/66     Admit date:  (Not on file) Last encounter with RMR:  12/30/2016   Allergy Codeine  No current outpatient prescriptions on file.   No current facility-administered medications for this visit.     Discontinued Meds:   There are no discontinued medications.  There are no active problems to display for this patient.   LABS    Component Value Date/Time   NA 138 12/30/2016 1432   NA 138 05/30/2011 2059   K 4.1 12/30/2016 1432   K 3.6 05/30/2011 2059   CL 104 12/30/2016 1432   CL 104 05/30/2011 2059   CO2 28 12/30/2016 1432   CO2 24 05/30/2011 2059   GLUCOSE 82 12/30/2016 1432   GLUCOSE 129 (H) 05/30/2011 2059   BUN 9 12/30/2016 1432   BUN 6 05/30/2011 2059   CREATININE 0.77 12/30/2016 1432   CREATININE 0.57 05/30/2011 2059   CALCIUM 9.5 12/30/2016 1432   CALCIUM 9.5 05/30/2011 2059   GFRNONAA >60 05/30/2011 2059   GFRAA >60 05/30/2011 2059   CMP     Component Value Date/Time   NA 138 12/30/2016 1432   K 4.1 12/30/2016 1432   CL 104 12/30/2016 1432   CO2 28 12/30/2016 1432   GLUCOSE 82 12/30/2016 1432   BUN 9 12/30/2016 1432   CREATININE 0.77 12/30/2016 1432   CALCIUM 9.5 12/30/2016 1432   GFRNONAA >60 05/30/2011 2059   GFRAA >60 05/30/2011 2059       Component Value Date/Time   WBC 7.8 12/30/2016 1432   WBC 6.2 05/30/2011 2059   HGB 13.2 12/30/2016 1432   HGB 12.6 05/30/2011 2059   HCT 40.2 12/30/2016 1432   HCT 36.2 05/30/2011  2059   MCV 91.8 12/30/2016 1432   MCV 90.7 05/30/2011 2059    Lipid Panel  No results found for: CHOL, TRIG, HDL, CHOLHDL, VLDL, LDLCALC, LDLDIRECT  ABG No results found for: PHART, PCO2ART, PO2ART, HCO3, TCO2, ACIDBASEDEF, O2SAT   Lab Results  Component Value Date   TSH 1.41 12/30/2016   BNP (last 3 results) No results for input(s): BNP in the last 8760 hours.  ProBNP (last 3 results) No results for input(s): PROBNP in the last 8760 hours.  Cardiac Panel (last 3 results) No results for input(s): CKTOTAL, CKMB, TROPONINI, RELINDX in the last 72 hours.  Iron/TIBC/Ferritin/ %Sat No results found for: IRON, TIBC, FERRITIN, IRONPCTSAT   EKG Orders placed or performed in visit on 12/30/16  . EKG 12-Lead     Prior Assessment and Plan Problem List as of 01/13/2017 Reviewed: 12/30/2016  4:36 PM by Joni ReiningKathryn Lawrence, NP   None       Imaging: No results found.

## 2017-01-13 NOTE — Patient Instructions (Signed)
Medication Instructions:  Your physician recommends that you continue on your current medications as directed. Please refer to the Current Medication list given to you today.   Labwork: NONE  Testing/Procedures: NONE  Follow-Up: Your physician wants you to follow-up in: 1 YEAR WITH DR. ROSS.  You will receive a reminder letter in the mail two months in advance. If you don't receive a letter, please call our office to schedule the follow-up appointment.   Any Other Special Instructions Will Be Listed Below (If Applicable).     If you need a refill on your cardiac medications before your next appointment, please call your pharmacy.   

## 2017-02-04 ENCOUNTER — Telehealth: Payer: Self-pay

## 2017-02-04 DIAGNOSIS — I10 Essential (primary) hypertension: Secondary | ICD-10-CM

## 2017-02-04 NOTE — Telephone Encounter (Signed)
LM for pt to pick up lab slip from office,[facility]

## 2017-02-04 NOTE — Telephone Encounter (Signed)
-----   Message from Lendon KaMichalene Wilson, RN sent at 02/03/2017  2:00 PM EDT ----- Regarding: BP LOG/[place] pt Hey there, good afternoon Dr. Tenny Crawoss reviewed the blood pressure log today.   I will have it scanned into epic asap.  She recommends a 24 hr urine sodium to be done.    Thanks! Michalene

## 2017-02-17 ENCOUNTER — Telehealth: Payer: Self-pay | Admitting: Internal Medicine

## 2017-02-17 NOTE — Telephone Encounter (Signed)
Patient needs to pick up lab slip and go to lab to get 24 hr urine collector>she also requested copy of office visit.

## 2017-02-17 NOTE — Telephone Encounter (Signed)
Patient states that she is waiting for lab order and paperwork to take to physician. / tg

## 2018-01-26 ENCOUNTER — Other Ambulatory Visit: Payer: Self-pay | Admitting: Internal Medicine

## 2018-01-26 DIAGNOSIS — R55 Syncope and collapse: Secondary | ICD-10-CM

## 2018-02-02 ENCOUNTER — Ambulatory Visit
Admission: RE | Admit: 2018-02-02 | Discharge: 2018-02-02 | Disposition: A | Discharge status: Home / Self Care | Payer: No Typology Code available for payment source | Source: Ambulatory Visit | Attending: Internal Medicine | Admitting: Internal Medicine

## 2018-02-02 DIAGNOSIS — R55 Syncope and collapse: Secondary | ICD-10-CM

## 2018-05-20 ENCOUNTER — Other Ambulatory Visit: Payer: Self-pay | Admitting: Adult Health

## 2018-10-28 ENCOUNTER — Other Ambulatory Visit: Payer: Self-pay | Admitting: Adult Health

## 2018-10-30 ENCOUNTER — Encounter (INDEPENDENT_AMBULATORY_CARE_PROVIDER_SITE_OTHER): Payer: Self-pay

## 2018-10-30 ENCOUNTER — Ambulatory Visit (INDEPENDENT_AMBULATORY_CARE_PROVIDER_SITE_OTHER): Payer: Self-pay | Admitting: Adult Health

## 2018-10-30 ENCOUNTER — Other Ambulatory Visit (HOSPITAL_COMMUNITY)
Admission: RE | Admit: 2018-10-30 | Discharge: 2018-10-30 | Disposition: A | Discharge status: Home / Self Care | Payer: Self-pay | Source: Ambulatory Visit | Attending: Adult Health | Admitting: Adult Health

## 2018-10-30 ENCOUNTER — Encounter: Payer: Self-pay | Admitting: Adult Health

## 2018-10-30 VITALS — BP 121/70 | HR 71 | Ht 67.0 in | Wt 146.0 lb

## 2018-10-30 DIAGNOSIS — F329 Major depressive disorder, single episode, unspecified: Secondary | ICD-10-CM

## 2018-10-30 DIAGNOSIS — Z87898 Personal history of other specified conditions: Secondary | ICD-10-CM

## 2018-10-30 DIAGNOSIS — R102 Pelvic and perineal pain: Secondary | ICD-10-CM

## 2018-10-30 DIAGNOSIS — Z1212 Encounter for screening for malignant neoplasm of rectum: Secondary | ICD-10-CM

## 2018-10-30 DIAGNOSIS — N946 Dysmenorrhea, unspecified: Secondary | ICD-10-CM

## 2018-10-30 DIAGNOSIS — Z1211 Encounter for screening for malignant neoplasm of colon: Secondary | ICD-10-CM

## 2018-10-30 DIAGNOSIS — Z01419 Encounter for gynecological examination (general) (routine) without abnormal findings: Secondary | ICD-10-CM | POA: Insufficient documentation

## 2018-10-30 DIAGNOSIS — N941 Unspecified dyspareunia: Secondary | ICD-10-CM

## 2018-10-30 DIAGNOSIS — F32A Depression, unspecified: Secondary | ICD-10-CM

## 2018-10-30 DIAGNOSIS — Z8742 Personal history of other diseases of the female genital tract: Secondary | ICD-10-CM

## 2018-10-30 LAB — HEMOCCULT GUIAC POC 1CARD (OFFICE): Fecal Occult Blood, POC: NEGATIVE

## 2018-10-30 MED ORDER — ELAGOLIX SODIUM 150 MG PO TABS: 1.0000 | ORAL_TABLET | Freq: Every day | ORAL | 0 refills | Status: DC

## 2018-10-30 NOTE — Progress Notes (Signed)
Patient ID: Laura Morales, female   DOB: [birth year], 42 y.o.   MRN: [id number] History of Present Illness: Laura Morales is a 42 year old white female, single, in for well woman gyn exam and pap.She is complaining of hormones being of, and pain in neck and left arm tingles,she had normal MRI of brain 02/02/18.She says she had labs and diagnosed with insulin resistance and PCO by PCP and has too much testosterone. 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 PCP is Dr Karilyn CotaGosrani.   Current Medications, Allergies, Past Medical History, Past Surgical History, Family History and Social History were reviewed in [facility] electronic medical record.     Review of Systems: Patient denies any headaches, hearing loss, fatigue, blurred vision, shortness of breath, chest pain, problems with bowel movements, urination. +pain with periods Pelvic pain +pain with sex,  Has nipple discharge since age 89, negative W/U Hormones off she says  +pain in neck and left arm tingles  She is on metformin and has diarrhea. She says she gets angry easily and scattered     Physical Exam:BP 121/70 (BP Location: Left Arm, Patient Position: Sitting, Cuff Size: Normal)   Pulse 71   Ht 5\' 7"  (1.702 m)   Wt 146 lb (66.2 kg)   LMP 10/15/2018   BMI 22.87 kg/m  General:  Well developed, well nourished, no acute distress Skin:  Warm and dry,+tattoos Neck:  Midline trachea, normal thyroid, good ROM, no lymphadenopathy Lungs; Clear to auscultation bilaterally Breast:  No dominant palpable mass, retraction, or nipple discharge, with bilateral nipple rods, then she squeezed the nipple and discharge was clear in multiple sites  Cardiovascular: Regular rate and rhythm Abdomen:  Soft, non tender, no hepatosplenomegaly Pelvic:  External genitalia is normal in appearance, no lesions.  The vagina is normal in appearance. Urethra has no lesions or masses. The cervix is deviated to the left and slightly everted at os and  has several nabothian cyst, pap with HPV performed.  Uterus is felt to be normal size, shape, and contour,uterus mildly tender.  No adnexal masses or tenderness noted.Bladder is non tender, no masses felt. Rectal: Good sphincter tone, no polyps, + hemorrhoids felt.  Hemoccult negative. Extremities/musculoskeletal:  No swelling or varicosities noted, no clubbing or cyanosis Psych:   alert and cooperative,seems happy Fall risk is high. PHQ 9 score is 17, denies being suicidal, and she declines meds Examination chaperoned by Malachy MoodJanet Young LPN. Will try Dewayne HatchOrilissa, to see if helps with pain.  Impression: 1. Encounter for gynecological examination with Papanicolaou smear of cervix   2. Screening for colorectal cancer   3. Depression, unspecified depression type   4. Pelvic pain   5. Dysmenorrhea   6. History of endometriosis   7. Dyspareunia in female   8. History of nipple discharge       Plan: Request copy of labs Meds ordered this encounter  Medications  . Elagolix Sodium (ORILISSA) 150 MG TABS    Sig: Take 1 tablet by mouth daily.    Dispense:  28 tablet    Refill:  0    Order Specific Question:   Supervising Provider    Answer:   Duane LopeEURE, LUTHER H [[id number]]  Try taking metformin at lunch  F/U in 3 weeks Physical in 1 year Pap in 3 if normal Use preparation H for hemorroids

## 2018-11-05 LAB — CYTOLOGY - PAP
CHLAMYDIA, DNA PROBE: NEGATIVE
Diagnosis: NEGATIVE
HPV: DETECTED — AB
NEISSERIA GONORRHEA: NEGATIVE

## 2018-11-09 ENCOUNTER — Encounter: Payer: Self-pay | Admitting: Adult Health

## 2018-11-09 ENCOUNTER — Telehealth: Payer: Self-pay | Admitting: Adult Health

## 2018-11-09 DIAGNOSIS — R87810 Cervical high risk human papillomavirus (HPV) DNA test positive: Secondary | ICD-10-CM

## 2018-11-09 HISTORY — DX: Cervical high risk human papillomavirus (HPV) DNA test positive: R87.810

## 2018-11-09 NOTE — Telephone Encounter (Signed)
Left message that pap was negative for malignancy and GC/CHL but +HPV, can discuss further at appt

## 2018-11-20 ENCOUNTER — Ambulatory Visit (INDEPENDENT_AMBULATORY_CARE_PROVIDER_SITE_OTHER): Payer: Self-pay | Admitting: Adult Health

## 2018-11-20 ENCOUNTER — Encounter: Payer: Self-pay | Admitting: Adult Health

## 2018-11-20 VITALS — BP 116/71 | HR 80 | Ht 69.0 in | Wt 148.0 lb

## 2018-11-20 DIAGNOSIS — Z8742 Personal history of other diseases of the female genital tract: Secondary | ICD-10-CM

## 2018-11-20 DIAGNOSIS — R87810 Cervical high risk human papillomavirus (HPV) DNA test positive: Secondary | ICD-10-CM

## 2018-11-20 DIAGNOSIS — F32A Depression, unspecified: Secondary | ICD-10-CM

## 2018-11-20 DIAGNOSIS — N946 Dysmenorrhea, unspecified: Secondary | ICD-10-CM

## 2018-11-20 DIAGNOSIS — F329 Major depressive disorder, single episode, unspecified: Secondary | ICD-10-CM

## 2018-11-20 MED ORDER — NORETHINDRONE 0.35 MG PO TABS: 1.0000 | ORAL_TABLET | Freq: Every day | ORAL | 11 refills | Status: DC

## 2018-11-20 NOTE — Progress Notes (Addendum)
Patient ID: Laura Morales, female   DOB: 13-Nov-1975, 43 y.o.   MRN: [id number] History of Present Illness: Laura Morales a 43 year old white female, back in follow up, form 10/30/18 visit, she did not take [place]. She is on NP thyroid.  PCP is Dr Karilyn Cota.    Current Medications, Allergies, Past Medical History, Past Surgical History, Family History and Social History were reviewed in [facility] record.     Review of Systems: Actually feels better, but did not take orilissa, after reading side effects    Physical Exam:BP 116/71 (BP Location: Right Arm, Patient Position: Sitting, Cuff Size: Normal)   Pulse 80   Ht 5\' 9"  (1.753 m)   Wt 148 lb (67.1 kg)   LMP 11/11/2018   BMI 21.86 kg/m  General:  Well developed, well nourished, no acute distress Skin:  Warm and dry Psych:  No mood changes, alert and cooperative,seems sad, snake died last night PHQ 9 score is 9, which is better was 17, 10/30/18. Discussed could try progesterone only OC to see if helps, and she wants to try, will rx Micronor.  Impression: 1. Dysmenorrhea   2. History of endometriosis   3. 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 Depression, unspecified depression type       Plan: Meds ordered this encounter  Medications  . norethindrone (MICRONOR,CAMILA,ERRIN) 0.35 MG tablet    Sig: Take 1 tablet (0.35 mg total) by mouth daily.    Dispense:  1 Package    Refill:  11    Order Specific Question:   Supervising Provider    Answer:   Duane Lope H [[id number]]   Use condoms or pullout til on 1 month F/U in 3 months  Repeat pap in 1 year

## 2019-01-21 ENCOUNTER — Encounter: Payer: Self-pay | Admitting: Neurology

## 2019-01-21 ENCOUNTER — Ambulatory Visit: Payer: Self-pay | Admitting: Neurology

## 2019-01-21 ENCOUNTER — Other Ambulatory Visit: Payer: Self-pay

## 2019-01-21 VITALS — BP 107/70 | HR 59 | Resp 16 | Ht 69.0 in | Wt 159.0 lb

## 2019-01-21 DIAGNOSIS — M50122 Cervical disc disorder at C5-C6 level with radiculopathy: Secondary | ICD-10-CM

## 2019-01-21 NOTE — Progress Notes (Signed)
Provider:  Melvyn Novas, MD   Referring Provider: Wilson Singer, MD Primary Care Physician:  Wilson Singer, MD  Chief Complaint  Patient presents with  . Spasms    Room 10.  Laura Morales is here for eval of left arm fasciculations onset 5 mos. ago. Intermittent numbness left arm, all fingers, occ. electric shock sensaion left arm, esp. when sneezing.  She works as a Curator. Left handed./fim    HPI:  Laura Morales is a 43 y.o. female patient is seen here on 01-21-2019 in a referral from Dr. Karilyn Cota for evaluation of fasciculations.   Review of Systems: Out of a complete 14 system review, the patient complains of only the following symptoms, and all other reviewed systems are negative.  The patient reports a spinning sensation vertiginous, cluster headaches, dizziness vision and sometimes feeling that her movement affects visual acuity and judgment of distance by changes.  She has a history of cystic breast disease, insulin resistant diabetes, supraventricular tachycardia, this was attributed to Wolff-Parkinson-White syndrome.  Endometriosis, polycystic ovarian syndrome, diagnosed 2 years ago, she has also a tendency to have hypotension.  She has had migraines and anxiety.  She endorsed aching muscles anxiety feeling hot or cold shortness of breath loss of vision insomnia, sleepiness, restless legs, racing thoughts, not enough sleep decreased energy and tremors spinning sensation can be accompanied by ringing in the ears.  Palpitations also present.  She is here today to be examinated for fasciculations. She reports that until 2.5 years ago she worked in a [employer] was very fit.   Social History   Socioeconomic History  . Marital status: Single    Spouse name: Not on file  . Number of children: Not on file  . Years of education: Not on file  . Highest education level: Not on file  Occupational History  . Not on file  Social Needs  . Financial resource strain: Not on file  .  Food insecurity:    Worry: Not on file    Inability: Not on file  . Transportation needs:    Medical: Not on file    Non-medical: Not on file  Tobacco Use  . Smoking status: Current Every Day Smoker    Packs/day: 0.50    Types: Cigarettes    Start date: 11/07/1989  . Smokeless tobacco: Former Neurosurgeon    Types: Chew  Substance and Sexual Activity  . Alcohol use: Yes    Alcohol/week: 3.0 standard drinks    Types: 3 Cans of beer per week    Comment: rarely  . Drug use: No  . Sexual activity: Yes    Birth control/protection: None  Lifestyle  . Physical activity:    Days per week: Not on file    Minutes per session: Not on file  . Stress: Not on file  Relationships  . Social connections:    Talks on phone: Not on file    Gets together: Not on file    Attends religious service: Not on file    Active member of club or organization: Not on file    Attends meetings of clubs or organizations: Not on file    Relationship status: Not on file  . Intimate partner violence:    Fear of current or ex partner: Not on file    Emotionally abused: Not on file    Physically abused: Not on file    Forced sexual activity: Not on file  Other Topics Concern  .  Not on file  Social History Narrative  . Not on file    Family History  Problem Relation Age of Onset  . Hypotension Mother   . Epilepsy Mother   . Cancer Mother        lung  . Diabetes Father   . Glaucoma Father   . Multiple sclerosis Sister   . Glaucoma Brother   . Heart failure Maternal Grandmother   . Cystic fibrosis Sister   . Seizures Sister   . Seizures Sister     Past Medical History:  Diagnosis Date  . Endometriosis   . Insulin resistance   . Other and unspecified ovarian cysts   . Papanicolaou smear of cervix with positive high risk human papilloma virus (HPV) test 11/09/2018  . PCOS (polycystic ovarian syndrome)   . SVT (supraventricular tachycardia) (HCC)   . Vaginal Pap smear, abnormal   . WPW  (Wolff-Parkinson-White syndrome) 2000    Past Surgical History:  Procedure Laterality Date  . COLPOSCOPY    . DILATION AND CURETTAGE OF UTERUS    . KNEE ARTHROSCOPY    . LAPAROSCOPY    . TONSILLECTOMY      Current Outpatient Medications  Medication Sig Dispense Refill  . ibuprofen (ADVIL,MOTRIN) 200 MG tablet Take 200 mg by mouth every 6 (six) hours as needed.    . metFORMIN (GLUCOPHAGE) 500 MG tablet Take by mouth 2 (two) times daily with a meal.    . Multiple Vitamin (MULTIVITAMIN) tablet Take by mouth daily. 2 gummies daily    . norethindrone (MICRONOR,CAMILA,ERRIN) 0.35 MG tablet Take 1 tablet (0.35 mg total) by mouth daily. 1 Package 11  . UNABLE TO FIND Thyroid med-once a day     No current facility-administered medications for this visit.     Allergies as of 01/21/2019 - Review Complete 01/21/2019  Allergen Reaction Noted  . Codeine Nausea And Vomiting 11/07/2016    Vitals: BP 107/70   Pulse (!) 59   Resp 16   Ht 5\' 9"  (1.753 m)   Wt 159 lb (72.1 kg)   BMI 23.48 kg/m  Last Weight:  Wt Readings from Last 1 Encounters:  01/21/19 159 lb (72.1 kg)   Last Height:   Ht Readings from Last 1 Encounters:  01/21/19 5\' 9"  (1.753 m)    Physical exam:  General: The patient is awake, alert and appears not in acute distress. The patient is a smoker , casually dressed.  Head: Normocephalic, atraumatic. Neck is supple.  Mallampati 3, neck circumference: 13.5 "  Cardiovascular:  Regular rate and rhythm, without  murmurs or carotid bruit, and without distended neck veins. Respiratory: Lungs are clear to auscultation. Skin:  Without evidence of edema, or rash Trunk: BMI is 23, not  elevated and patient has normal posture.  Neurologic exam : The patient is awake and alert, oriented to place and time.    There is a normal attention span & concentration ability. Speech is fluent, somewhat pressured, anxious.   Cranial nerves: Pupils are equal and briskly reactive to light.   Extraocular movements  in vertical and horizontal planes intact and without nystagmus.  Visual fields by finger perimetry are intact. Hearing to finger rub intact. Facial sensation intact to fine touch. Facial motor strength is symmetric and tongue and uvula move midline. Tongue protrusion into either cheek is normal. Shoulder shrug is normal.   Motor exam:   Normal tone ,muscle bulk and symmetric strength in all extremities. Normal grip, biceps and triceps tone.  No fasciculations visible now.   Sensory:  Fine touch, pinprick and vibration were tested in all extremities. Proprioception was normal. She reports sensory changes, numbness and pain with pin and needles radiating form the left paraspinal C5-6-7  into the arm and palm- descend into the lateral three fingers of he left hand and into the thumb , through the wrist.     Coordination: Rapid alternating movements in the fingers/hands were normal. Finger-to-nose maneuver  normal without evidence of ataxia, dysmetria or tremor.  Gait and station: Patient walks without assistive device. Strength within normal limits. Stance is stable and normal.  Tandem gait is unfragmented. Romberg testing is negative   Deep tendon reflexes: in the  upper and lower extremities are symmetric and intact. Babinski maneuver response is downgoing.   Assessment:  After physical and neurologic examination, review of laboratory studies, imaging, neurophysiology testing and pre-existing records, assessment is that of :   Radiculopathy, possiblle myelopathy , cervical spine evaluation. Dermatomal  Distribution into the left thumb and left hand - middle, ring and small finger. No weakness.   EMG and NCV ordered.   Plan:  Treatment plan and additional workup : Rv after test.      Melvyn Novas MD 01/21/2019

## 2019-02-18 ENCOUNTER — Ambulatory Visit: Payer: Self-pay | Admitting: Adult Health

## 2019-03-09 ENCOUNTER — Ambulatory Visit (INDEPENDENT_AMBULATORY_CARE_PROVIDER_SITE_OTHER): Payer: Self-pay | Admitting: Internal Medicine

## 2019-07-21 ENCOUNTER — Other Ambulatory Visit (INDEPENDENT_AMBULATORY_CARE_PROVIDER_SITE_OTHER): Payer: Self-pay | Admitting: Internal Medicine

## 2019-07-21 ENCOUNTER — Telehealth (INDEPENDENT_AMBULATORY_CARE_PROVIDER_SITE_OTHER): Payer: Self-pay

## 2019-07-21 MED ORDER — THYROID 60 MG PO TABS: 60.0000 mg | ORAL_TABLET | Freq: Every day | ORAL | 3 refills | Status: DC

## 2019-07-21 NOTE — Telephone Encounter (Signed)
Order has been sent to pharmacy  

## 2019-09-06 ENCOUNTER — Other Ambulatory Visit (INDEPENDENT_AMBULATORY_CARE_PROVIDER_SITE_OTHER): Payer: Self-pay | Admitting: *Deleted

## 2019-09-06 DIAGNOSIS — I10 Essential (primary) hypertension: Secondary | ICD-10-CM

## 2019-09-06 MED ORDER — METFORMIN HCL 500 MG PO TABS: 500.0000 mg | ORAL_TABLET | Freq: Two times a day (BID) | ORAL | 0 refills | Status: DC

## 2019-09-06 NOTE — Telephone Encounter (Signed)
Pt needs a refill on her metformin sent to [facility] in [place] she took her last one this am.

## 2019-09-09 ENCOUNTER — Ambulatory Visit (INDEPENDENT_AMBULATORY_CARE_PROVIDER_SITE_OTHER): Payer: Self-pay | Admitting: Internal Medicine

## 2019-10-12 ENCOUNTER — Encounter (INDEPENDENT_AMBULATORY_CARE_PROVIDER_SITE_OTHER): Payer: Self-pay | Admitting: Nurse Practitioner

## 2019-10-12 ENCOUNTER — Other Ambulatory Visit: Payer: Self-pay

## 2019-10-12 ENCOUNTER — Ambulatory Visit (INDEPENDENT_AMBULATORY_CARE_PROVIDER_SITE_OTHER): Payer: Self-pay | Admitting: Nurse Practitioner

## 2019-10-12 VITALS — BP 140/62 | HR 87 | Temp 98.0°F | Ht 69.0 in | Wt 159.0 lb

## 2019-10-12 DIAGNOSIS — R079 Chest pain, unspecified: Secondary | ICD-10-CM | POA: Insufficient documentation

## 2019-10-12 DIAGNOSIS — R5383 Other fatigue: Secondary | ICD-10-CM

## 2019-10-12 DIAGNOSIS — R002 Palpitations: Secondary | ICD-10-CM

## 2019-10-12 DIAGNOSIS — A6920 Lyme disease, unspecified: Secondary | ICD-10-CM | POA: Insufficient documentation

## 2019-10-12 DIAGNOSIS — R21 Rash and other nonspecific skin eruption: Secondary | ICD-10-CM

## 2019-10-12 MED ORDER — DOXYCYCLINE HYCLATE 100 MG PO TABS: 100.0000 mg | ORAL_TABLET | Freq: Two times a day (BID) | ORAL | 0 refills | Status: DC

## 2019-10-12 NOTE — Assessment & Plan Note (Signed)
Her description of her chest pain is worrisome especially based on her cardiac history.  EKG today did not show any worrisome abnormalities.  I am going to refer her back to cardiology who she has not seen in approximately 3 years for further evaluation.  I did recommend that if she starts to experience chest pain between now and her next appointment with [place] or with cardiology that she should go to the emergency department for investigation.  She tells me she understands.

## 2019-10-12 NOTE — Assessment & Plan Note (Signed)
We will treat her for Lyme disease based on her history of removing a tick and having a bull's-eye rash appear.  She tells me she has tolerated doxycycline in the past we will give her a 14-day course of this.  She tells me that she has not had any unprotected sex since her last menstrual cycle.  She was told to use contraception while on this medication.  She tells me she understands.  She is encouraged to call the office with any questions or concerns or if her symptoms worsen.

## 2019-10-12 NOTE — Patient Instructions (Signed)
Thank you for choosing [facility] as your medical provider! If you have any questions or concerns regarding your health care, please do not hesitate to call our office.  1.  Tick bite/rash: We will treat you for Lyme disease based on your rash and history of tick bite.  Complete your entire course of antibiotic therapy.  Make sure to use contraception while taking this medication. 2.  Chest pain: I will send urgent referral to cardiology's that she can have further examination done.  In addition I am collecting blood work, and further recommendations may be made based on those results.  As we discussed in the office if you have severe chest pain prior to your follow-up appointment or prior to seeing cardiology please proceed to the emergency department.  Please follow-up as scheduled in 1 month for your annual physical exam. We look forward to seeing you again soon! Have a great birthday and happy holidays!!  At [facility] we value your feedback. You may receive a survey about your visit today. Please share your experience as we strive to create trusting relationships with our patients to provide genuine, compassionate, quality care.  We appreciate your understanding and patience as we review any laboratory studies, imaging, and other diagnostic tests that are ordered as we care for you. We do our best to address any and all results in a timely manner. If you do not hear about test results within 1 week, please do not hesitate to contact us. If we referred you to a specialist during your visit or ordered imaging testing, contact the office if you have not been contacted to be scheduled within 1 weeks.  We also encourage the use of MyChart, which contains your medical information for your review as well. If you are not enrolled in this feature, an access code is on this after visit summary for your convenience. Thank you for allowing [place] to be involved in your care.

## 2019-10-12 NOTE — Progress Notes (Signed)
Subjective:  Patient ID: Laura Morales, female    DOB: [birth year]  Age: 43 y.o. MRN: [id number]  CC:  Chief Complaint  Patient presents with  . Tick Removal      HPI  This patient comes in today for an acute visit to discuss a rash that has appeared on the lower right side of her abdomen.  She tells me this rash occurred after removing a tick from that area.  She tells me she saw the tick approximately 2 weeks ago and removed it at that time.  The rash appeared about 5 days ago.  She denies any fever, but has experienced chills and some muscle aches.  She also mentions to me that she is been having worsening chest pain and cardiac palpitations.  She also experiences worsening shortness of breath even with minimal activity.  She tells me activity will often elicit the chest pain, and rest does not seem to alleviate it.  She was diagnosed with [employer] syndrome as well as supraventricular tachycardia approximately 3 years ago.  She does not follow-up with a cardiologist regularly.  Her chest pain is also accompanied by feelings of a racing heart.  She tells me her heart races more frequently and seems to race for a longer period of time then it used to.  Nothing seems to improve the pain or sensation of heart racing, she tells me it will eventually just stop on its own.   Past Medical History:  Diagnosis Date  . Endometriosis   . Insulin resistance   . Other and unspecified ovarian cysts   . Papanicolaou smear of cervix with positive high risk human papilloma virus (HPV) test 11/09/2018  . PCOS (polycystic ovarian syndrome)   . SVT (supraventricular tachycardia) (HCC)   . Vaginal Pap smear, abnormal   . WPW (Wolff-Parkinson-White syndrome) 2000      Family History  Problem Relation Age of Onset  . Hypotension Mother   . Epilepsy Mother   . Cancer Mother        lung  . Diabetes Father   . Glaucoma Father   . Multiple sclerosis Sister   . Glaucoma Brother   .  Heart failure Maternal Grandmother   . Cystic fibrosis Sister   . Seizures Sister   . Seizures Sister     Social History   Social History Narrative  . Not on file   Social History   Tobacco Use  . Smoking status: Current Every Day Smoker    Packs/day: 0.50    Types: Cigarettes    Start date: 11/07/1989  . Smokeless tobacco: Former Neurosurgeon    Types: Chew  Substance Use Topics  . Alcohol use: Yes    Alcohol/week: 3.0 standard drinks    Types: 3 Cans of beer per week    Comment: rarely     No outpatient medications have been marked as taking for the 10/12/19 encounter (Office Visit) with Elenore Paddy, NP.    ROS:  Review of Systems  Constitutional: Negative for chills and fever.  Eyes: Positive for blurred vision.  Respiratory: Positive for shortness of breath (with minimal physical activity). Negative for cough.        Chest congestion  Cardiovascular: Positive for chest pain (Chronic, stable, no changes to ) and palpitations.  Skin: Positive for rash.  Neurological: Positive for dizziness.     Objective:   Today's Vitals: BP 140/62 (BP Location: Right Arm, Patient Position: Sitting, Cuff Size:  Small)   Pulse 87   Temp 98 F (36.7 C) (Temporal)   Ht 5\' 9"  (1.753 m)   Wt 159 lb (72.1 kg)   LMP 10/06/2019   SpO2 97%   BMI 23.48 kg/m  Vitals with BMI 10/12/2019 01/21/2019 11/20/2018  Height 5\' 9"  5\' 9"  5\' 9"   Weight 159 lbs 159 lbs 148 lbs  BMI 23.47 37.62 83.15  Systolic 176 160 737  Diastolic 62 70 71  Pulse 87 59 80     Physical Exam Vitals signs reviewed.  Constitutional:      Appearance: Normal appearance.  HENT:     Head: Normocephalic and atraumatic.  Cardiovascular:     Rate and Rhythm: Normal rate and regular rhythm.     Pulses: Normal pulses.     Heart sounds: Normal heart sounds.  Pulmonary:     Effort: Pulmonary effort is normal.     Breath sounds: Normal breath sounds.  Skin:    General: Skin is warm and dry.     Findings: Rash  (erythema migrans rash noted to area that tick was removed) present.  Neurological:     General: No focal deficit present.     Mental Status: She is alert and oriented to person, place, and time.  Psychiatric:        Mood and Affect: Mood normal.        Behavior: Behavior normal.        Thought Content: Thought content normal.        Judgment: Judgment normal.   EKG: sinus arrhythmia and short PR interval      Assessment   1. Erythema migrans (Lyme disease)   2. Rash   3. Palpitations   4. Fatigue, unspecified type   5. Chest pain, unspecified type       Tests ordered Orders Placed This Encounter  Procedures  . TSH  . T3, Free  . T4, Free  . Lyme Disease Abs IgG, IgM, IFA, CSF  . B. burgdorfi Antibody  . CBC with Differential/Platelets     Plan: Please see assessment and plan per problem list below.   Meds ordered this encounter  Medications  . doxycycline (VIBRA-TABS) 100 MG tablet    Sig: Take 1 tablet (100 mg total) by mouth 2 (two) times daily.    Dispense:  28 tablet    Refill:  0    Order Specific Question:   Supervising Provider    Answer:   Doree Albee [[id number]]    Patient to follow-up in 1 month for annual physical exam.  Ailene Ards, NP

## 2019-10-13 ENCOUNTER — Telehealth: Payer: Self-pay | Admitting: Internal Medicine

## 2019-10-13 LAB — B. BURGDORFI ANTIBODIES: B burgdorferi Ab IgG+IgM: <0.9 index

## 2019-10-13 LAB — CBC WITH DIFFERENTIAL/PLATELET
Absolute Monocytes: 504 cells/uL (ref 200–950)
Basophils Absolute: 28 cells/uL (ref 0–200)
Basophils Relative: 0.4 %
Eosinophils Absolute: 121 cells/uL (ref 15–500)
Eosinophils Relative: 1.7 %
HCT: 38.2 % (ref 35.0–45.0)
Hemoglobin: 12.9 g/dL (ref 11.7–15.5)
Lymphs Abs: 1342 cells/uL (ref 850–3900)
MCH: 31.2 pg (ref 27.0–33.0)
MCHC: 33.8 g/dL (ref 32.0–36.0)
MCV: 92.3 fL (ref 80.0–100.0)
MPV: 11.3 fL (ref 7.5–12.5)
Monocytes Relative: 7.1 %
Neutro Abs: 5105 cells/uL (ref 1500–7800)
Neutrophils Relative %: 71.9 %
Platelets: 220 10*3/uL (ref 140–400)
RBC: 4.14 10*6/uL (ref 3.80–5.10)
RDW: 12 % (ref 11.0–15.0)
Total Lymphocyte: 18.9 %
WBC: 7.1 10*3/uL (ref 3.8–10.8)

## 2019-10-13 LAB — T4, FREE: Free T4: 1.1 ng/dL (ref 0.8–1.8)

## 2019-10-13 LAB — T3, FREE: T3, Free: 3.2 pg/mL (ref 2.3–4.2)

## 2019-10-13 LAB — TSH: TSH: 0.7 mIU/L

## 2019-10-13 NOTE — Telephone Encounter (Signed)
Ok with me  J Laura Blomberg MD 

## 2019-10-13 NOTE — Telephone Encounter (Signed)
New Message     Pt would like to transfer care from Dr Harrington Challenger to Dr Harl Bowie   Please advise

## 2019-10-14 NOTE — Telephone Encounter (Signed)
Noted appt made with Dr. Harl Bowie

## 2019-10-14 NOTE — Telephone Encounter (Signed)
Agree with Tx request   I go to [place] infrequently

## 2019-10-18 ENCOUNTER — Encounter (INDEPENDENT_AMBULATORY_CARE_PROVIDER_SITE_OTHER): Payer: Self-pay | Admitting: Nurse Practitioner

## 2019-11-17 ENCOUNTER — Encounter (INDEPENDENT_AMBULATORY_CARE_PROVIDER_SITE_OTHER): Payer: Self-pay | Admitting: Internal Medicine

## 2019-11-30 ENCOUNTER — Other Ambulatory Visit: Payer: Self-pay

## 2019-11-30 ENCOUNTER — Ambulatory Visit: Payer: Self-pay | Admitting: Cardiology

## 2019-11-30 ENCOUNTER — Ambulatory Visit: Payer: BC Managed Care – PPO | Attending: Internal Medicine

## 2019-11-30 DIAGNOSIS — Z20822 Contact with and (suspected) exposure to covid-19: Secondary | ICD-10-CM | POA: Insufficient documentation

## 2019-12-01 LAB — NOVEL CORONAVIRUS, NAA: SARS-CoV-2, NAA: NOT DETECTED

## 2019-12-06 ENCOUNTER — Other Ambulatory Visit (INDEPENDENT_AMBULATORY_CARE_PROVIDER_SITE_OTHER): Payer: Self-pay | Admitting: Internal Medicine

## 2019-12-15 ENCOUNTER — Encounter (INDEPENDENT_AMBULATORY_CARE_PROVIDER_SITE_OTHER): Payer: Self-pay | Admitting: Internal Medicine

## 2019-12-20 ENCOUNTER — Telehealth (INDEPENDENT_AMBULATORY_CARE_PROVIDER_SITE_OTHER): Payer: Self-pay | Admitting: Internal Medicine

## 2019-12-20 NOTE — Telephone Encounter (Signed)
Please call this patient back and tell her that she should stop taking the NP thyroid and make an appointment to see me in about 2 weeks so we can check her levels and see how she is doing.

## 2020-01-13 ENCOUNTER — Encounter (INDEPENDENT_AMBULATORY_CARE_PROVIDER_SITE_OTHER): Payer: Self-pay | Admitting: Nurse Practitioner

## 2020-01-13 ENCOUNTER — Ambulatory Visit (INDEPENDENT_AMBULATORY_CARE_PROVIDER_SITE_OTHER): Payer: BC Managed Care – PPO | Admitting: Nurse Practitioner

## 2020-01-13 ENCOUNTER — Other Ambulatory Visit: Payer: Self-pay

## 2020-01-13 VITALS — BP 105/70 | HR 74 | Temp 98.0°F | Ht 70.0 in | Wt 161.4 lb

## 2020-01-13 DIAGNOSIS — R079 Chest pain, unspecified: Secondary | ICD-10-CM | POA: Diagnosis not present

## 2020-01-13 DIAGNOSIS — R5383 Other fatigue: Secondary | ICD-10-CM

## 2020-01-13 DIAGNOSIS — R002 Palpitations: Secondary | ICD-10-CM | POA: Diagnosis not present

## 2020-01-13 NOTE — Progress Notes (Signed)
Subjective:  Patient ID: Laura Morales, female    DOB: 1976/11/01  Age: 44 y.o. MRN: [id number]  CC:  Chief Complaint  Patient presents with  . Thyroid Problem  . Follow-up      HPI  This patient comes in today for the above.   She was having palpitations, anxiety, diaphoresis, and difficulty sleeping a few weeks ago.  At that time she called the office and she was told to discontinue her thyroid medication.  She was on desiccated thyroid for the off label treatment of fatigue and previously have been tolerating this well.  Since stopping the thyroid medicine she tells me she feels much better and has not had any additional significant episodes of cardiac palpitations with anxiety and diaphoresis.  In addition she tells me she is sleeping better.  Per chart review I see that last time she was evaluated in this office was in December, at that time she was complaining of chest pain.  She was encouraged to follow-up with a cardiologist, but has not been able to see them.  This is due to having to cancel her appointment while awaiting test results for COVID-19 virus.  She tells me that she still does have episodes of chest pain, but these only occur when she is vigorously working out.  She thinks that this could be related to her lungs as she admits to smoking more regularly.  She has also started a new job that does require a lot of physical activity, and has been tolerating this job well.  She also mentions that she has been experiencing diarrhea since she started her Metformin which she takes for management of her PCOS.  She is currently taking 500 mg daily, but continues to have diarrhea.   Past Medical History:  Diagnosis Date  . Endometriosis   . Insulin resistance   . Other and unspecified ovarian cysts   . Papanicolaou smear of cervix with positive high risk human papilloma virus (HPV) test 11/09/2018  . PCOS (polycystic ovarian syndrome)   . SVT (supraventricular  tachycardia) (HCC)   . Vaginal Pap smear, abnormal   . WPW (Wolff-Parkinson-White syndrome) 2000      Family History  Problem Relation Age of Onset  . Hypotension Mother   . Epilepsy Mother   . Cancer Mother        lung  . Diabetes Father   . Glaucoma Father   . Multiple sclerosis Sister   . Glaucoma Brother   . Heart failure Maternal Grandmother   . Cystic fibrosis Sister   . Seizures Sister   . Seizures Sister     Social History   Social History Narrative  . Not on file   Social History   Tobacco Use  . Smoking status: Current Every Day Smoker    Packs/day: 0.50    Types: Cigarettes    Start date: 11/07/1989  . Smokeless tobacco: Former Neurosurgeon    Types: Chew  Substance Use Topics  . Alcohol use: Yes    Alcohol/week: 3.0 standard drinks    Types: 3 Cans of beer per week    Comment: rarely     No outpatient medications have been marked as taking for the 01/13/20 encounter (Office Visit) with Elenore Paddy, NP.    ROS:  Negative unless otherwise stated in HPI.   Objective:   Today's Vitals: BP 105/70 (BP Location: Right Arm, Patient Position: Sitting, Cuff Size: Normal)   Pulse 74   Temp  98 F (36.7 C) (Temporal)   Ht 5\' 10"  (1.778 m)   Wt 161 lb 6.4 oz (73.2 kg)   SpO2 94%   BMI 23.16 kg/m  Vitals with BMI 01/13/2020 10/12/2019 01/21/2019  Height 5\' 10"  5\' 9"  5\' 9"   Weight 161 lbs 6 oz 159 lbs 159 lbs  BMI 23.16 90.30 09.23  Systolic 300 762 263  Diastolic 70 62 70  Pulse 74 87 59     Physical Exam Vitals reviewed.  Constitutional:      General: She is not in acute distress.    Appearance: Normal appearance.  HENT:     Head: Normocephalic and atraumatic.  Neck:     Vascular: No carotid bruit.  Cardiovascular:     Rate and Rhythm: Normal rate and regular rhythm.     Pulses: Normal pulses.     Heart sounds: Normal heart sounds.  Pulmonary:     Effort: Pulmonary effort is normal.     Breath sounds: Normal breath sounds.  Skin:    General:  Skin is warm and dry.  Neurological:     General: No focal deficit present.     Mental Status: She is alert and oriented to person, place, and time.  Psychiatric:        Mood and Affect: Mood normal.        Behavior: Behavior normal.        Judgment: Judgment normal.          Assessment   1. Palpitations   2. Fatigue, unspecified type   3. Chest pain, unspecified type     Tests ordered Orders Placed This Encounter  Procedures  . TSH  . T3, Free  . T4, Free     Plan: 1.,  2. , 3. Both symptoms seem to be much improved.  I will collect thyroid panel for further evaluation, but for now she will continue off of her thyroid medication.  I again encouraged her to follow-up with her cardiologist for further evaluation.  I also told her if she has an acute episode of chest pain that does not resolve spontaneously within a few moments, she needs to proceed to the emergency department.  She tells me she understands.  I also encouraged her to abstain from smoking if possible as her pain may be related to her lungs with vigorous activity.  She tells me she will consider this.  I also recommend that she reduce her intake of Metformin to 250 mg daily to see if her diarrhea resolves.  No orders of the defined types were placed in this encounter.   Patient to follow-up in 3 months for annual physical exam.  She was encouraged to call us with any questions or concerns prior to her next appointment.  Ailene Ards, NP

## 2020-01-14 LAB — T3, FREE: T3, Free: 2.9 pg/mL (ref 2.3–4.2)

## 2020-01-14 LAB — T4, FREE: Free T4: 1.1 ng/dL (ref 0.8–1.8)

## 2020-01-14 LAB — TSH: TSH: 1.72 mIU/L

## 2020-02-02 ENCOUNTER — Telehealth (INDEPENDENT_AMBULATORY_CARE_PROVIDER_SITE_OTHER): Payer: Self-pay | Admitting: Internal Medicine

## 2020-02-02 NOTE — Telephone Encounter (Signed)
As discussed, see if you can get this patient to get an appointment tomorrow for an acute visit.  Thanks.

## 2020-02-02 NOTE — Telephone Encounter (Signed)
Appt on 3/25 @2pm  40 min visit.

## 2020-02-03 ENCOUNTER — Ambulatory Visit (INDEPENDENT_AMBULATORY_CARE_PROVIDER_SITE_OTHER): Payer: BC Managed Care – PPO | Admitting: Internal Medicine

## 2020-02-03 ENCOUNTER — Encounter (INDEPENDENT_AMBULATORY_CARE_PROVIDER_SITE_OTHER): Payer: Self-pay | Admitting: Internal Medicine

## 2020-02-03 ENCOUNTER — Other Ambulatory Visit: Payer: Self-pay

## 2020-02-03 ENCOUNTER — Other Ambulatory Visit (INDEPENDENT_AMBULATORY_CARE_PROVIDER_SITE_OTHER): Payer: Self-pay | Admitting: Internal Medicine

## 2020-02-03 VITALS — BP 118/70 | HR 76 | Temp 98.2°F | Ht 70.0 in | Wt 158.4 lb

## 2020-02-03 DIAGNOSIS — R5383 Other fatigue: Secondary | ICD-10-CM | POA: Diagnosis not present

## 2020-02-03 DIAGNOSIS — E282 Polycystic ovarian syndrome: Secondary | ICD-10-CM

## 2020-02-03 DIAGNOSIS — R232 Flushing: Secondary | ICD-10-CM | POA: Diagnosis not present

## 2020-02-03 DIAGNOSIS — G4700 Insomnia, unspecified: Secondary | ICD-10-CM | POA: Diagnosis not present

## 2020-02-03 DIAGNOSIS — R002 Palpitations: Secondary | ICD-10-CM

## 2020-02-03 NOTE — Progress Notes (Signed)
Metrics: Intervention Frequency ACO  Documented Smoking Status Yearly  Screened one or more times in 24 months  Cessation Counseling or  Active cessation medication Past 24 months  Past 24 months   Guideline developer: UpToDate (See UpToDate for funding source) Date Released: 2014       Wellness Office Visit  Subjective:  Patient ID: Laura Morales, female    DOB: June 22, 1976  Age: 44 y.o. MRN: [id number]  CC: This lady comes in as an acute visit because for the last week or so, she has been having symptoms of tremulousness again, hot flashes, extreme fatigue, headaches. HPI  Previously, I diagnosed her with PCOS.  She has been on Metformin but she really not tolerating this that is giving her diarrhea. She was seen by Judson Roch about 3 weeks ago and after discontinuing thyroid based on her symptoms that she was having.  At that time, her symptoms had improved significantly and her T3 as expected was trending down.  It was 2.9 compared to 3.2. Now it appears that some of the symptoms that had helped her with the thyroid are returning. She also complains of hot flashes, menorrhagia.  She wonders if she is in the perimenopause.  I doubt this is the case. Past Medical History:  Diagnosis Date  . Endometriosis   . Insulin resistance   . Other and unspecified ovarian cysts   . Papanicolaou smear of cervix with positive high risk human papilloma virus (HPV) test 11/09/2018  . PCOS (polycystic ovarian syndrome)   . SVT (supraventricular tachycardia) ([place])   . Vaginal Pap smear, abnormal   . WPW (Wolff-Parkinson-White syndrome) 2000      Family History  Problem Relation Age of Onset  . Hypotension Mother   . Epilepsy Mother   . Cancer Mother        lung  . Diabetes Father   . Glaucoma Father   . Multiple sclerosis Sister   . Glaucoma Brother   . Heart failure Maternal Grandmother   . Cystic fibrosis Sister   . Seizures Sister   . Seizures Sister     Social History   Social  History Narrative   Lives with boyfriend of 7-8 years.   Social History   Tobacco Use  . Smoking status: Current Every Day Smoker    Packs/day: 0.50    Types: Cigarettes    Start date: 11/07/1989  . Smokeless tobacco: Former Systems developer    Types: Chew  Substance Use Topics  . Alcohol use: Yes    Alcohol/week: 3.0 standard drinks    Types: 3 Cans of beer per week    Comment: rarely    Current Meds  Medication Sig  . metFORMIN (GLUCOPHAGE) 500 MG tablet Take 250 mg by mouth 1 day or 1 dose.  . Multiple Vitamin (MULTIVITAMIN) tablet Take by mouth daily. 2 gummies daily  . [DISCONTINUED] ibuprofen (ADVIL,MOTRIN) 200 MG tablet Take 200 mg by mouth every 6 (six) hours as needed.  . [DISCONTINUED] norethindrone (MICRONOR,CAMILA,ERRIN) 0.35 MG tablet Take 1 tablet (0.35 mg total) by mouth daily.  . [DISCONTINUED] NP THYROID 60 MG tablet TAKE 1 TABLET BY MOUTH ONCE DAILY BEFORE BREAKFAST  . [DISCONTINUED] UNABLE TO FIND Thyroid med-once a day       Objective:   Today's Vitals: BP 118/70 (BP Location: Right Arm, Patient Position: Sitting, Cuff Size: Normal)   Pulse 76   Temp 98.2 F (36.8 C) (Temporal)   Ht 5\' 10"  (8.546 m)   Wt 158 lb  6.4 oz (71.8 kg)   SpO2 96%   BMI 22.73 kg/m  Vitals with BMI 02/03/2020 01/13/2020 10/12/2019  Height 5\' 10"  5\' 10"  5\' 9"   Weight 158 lbs 6 oz 161 lbs 6 oz 159 lbs  BMI 22.73 23.16 23.47  Systolic 118 105  Diastolic 70 70 62  Pulse 76 74 87     Physical Exam   She looks systemically well.  Weight is stable.  Blood pressure is stable and she does not have a tachycardia at rest.    Assessment   1. Palpitations   2. Fatigue, unspecified type   3. Insomnia, unspecified type   4. Hot flashes   5. PCOS (polycystic ovarian syndrome)       Tests ordered Orders Placed This Encounter  Procedures  . Progesterone  . Luteinizing hormone  . Follicle stimulating hormone  . T3, free  . Testos,Total,Free and SHBG (Female)  . CBC  .  COMPLETE METABOLIC PANEL WITH GFR     Plan: 1. Blood work is ordered above. 2. I think we can try her on a lower dose of NP thyroid 30 mg tablets, take 1 daily and I have given her samples today. 3. We will see what the blood work shows.  We had previously talked about progesterone to help with some of her symptoms that she is having but she was not keen on this previously. 4. I have told her to discontinue Metformin and she is clearly not tolerating it. 5. Follow-up as scheduled in June for an annual physical exam and further recommendations will depend on these results. 6. I spent 30 minutes with her today discussing her symptoms and formulating a plan.   No orders of the defined types were placed in this encounter.   , MD

## 2020-02-07 LAB — CBC
HCT: 40.6 % (ref 35.0–45.0)
Hemoglobin: 13.5 g/dL (ref 11.7–15.5)
MCH: 31 pg (ref 27.0–33.0)
MCHC: 33.3 g/dL (ref 32.0–36.0)
MCV: 93.1 fL (ref 80.0–100.0)
MPV: 10.5 fL (ref 7.5–12.5)
Platelets: 261 10*3/uL (ref 140–400)
RBC: 4.36 10*6/uL (ref 3.80–5.10)
RDW: 12.1 % (ref 11.0–15.0)
WBC: 7.9 10*3/uL (ref 3.8–10.8)

## 2020-02-07 LAB — COMPLETE METABOLIC PANEL WITH GFR
AG Ratio: 2.1 (calc) (ref 1.0–2.5)
ALT: 14 U/L (ref 6–29)
AST: 14 U/L (ref 10–30)
Albumin: 4.4 g/dL (ref 3.6–5.1)
Alkaline phosphatase (APISO): 42 U/L (ref 31–125)
BUN: 9 mg/dL (ref 7–25)
CO2: 27 mmol/L (ref 20–32)
Calcium: 9.5 mg/dL (ref 8.6–10.2)
Chloride: 103 mmol/L (ref 98–110)
Creat: 0.75 mg/dL (ref 0.50–1.10)
GFR, Est African American: 115 mL/min/{1.73_m2} (ref >=60)
GFR, Est Non African American: 99 mL/min/{1.73_m2} (ref >=60)
Globulin: 2.1 g/dL (calc) (ref 1.9–3.7)
Glucose, Bld: 97 mg/dL (ref 65–99)
Potassium: 4.2 mmol/L (ref 3.5–5.3)
Sodium: 138 mmol/L (ref 135–146)
Total Bilirubin: 0.5 mg/dL (ref 0.2–1.2)
Total Protein: 6.5 g/dL (ref 6.1–8.1)

## 2020-02-07 LAB — PROGESTERONE: Progesterone: <0.5 ng/mL

## 2020-02-07 LAB — TESTOS,TOTAL,FREE AND SHBG (FEMALE)
Free Testosterone: 2.4 pg/mL (ref 0.1–6.4)
Sex Hormone Binding: 54 nmol/L (ref 17–124)
Testosterone, Total, LC-MS-MS: 21 ng/dL (ref 2–45)

## 2020-02-07 LAB — T3, FREE: T3, Free: 2.8 pg/mL (ref 2.3–4.2)

## 2020-02-07 LAB — FOLLICLE STIMULATING HORMONE: FSH: 5.6 m[IU]/mL

## 2020-02-07 LAB — LUTEINIZING HORMONE: LH: 7.7 m[IU]/mL

## 2020-02-14 NOTE — Progress Notes (Signed)
Patient called. Asked her to go into mychart read her message and reply back if she has any concerns.

## 2020-02-28 ENCOUNTER — Telehealth (INDEPENDENT_AMBULATORY_CARE_PROVIDER_SITE_OTHER): Payer: Self-pay

## 2020-02-28 ENCOUNTER — Other Ambulatory Visit (INDEPENDENT_AMBULATORY_CARE_PROVIDER_SITE_OTHER): Payer: Self-pay | Admitting: Internal Medicine

## 2020-02-28 MED ORDER — THYROID 30 MG PO TABS: 30.0000 mg | ORAL_TABLET | Freq: Every day | ORAL | 3 refills | Status: DC

## 2020-02-28 NOTE — Telephone Encounter (Signed)
Isis is calling stating that the NP Thyroid 30 mg she is doing well on it she states a Rx can be called in , please advise?

## 2020-02-28 NOTE — Telephone Encounter (Signed)
Please let the patient know that I have sent a new prescription for NP thyroid 30 mg tablet, take 1 daily to the [facility] in [place].

## 2020-02-29 NOTE — Telephone Encounter (Signed)
Patient is aware 

## 2020-03-10 ENCOUNTER — Encounter (INDEPENDENT_AMBULATORY_CARE_PROVIDER_SITE_OTHER): Payer: Self-pay | Admitting: Internal Medicine

## 2020-03-13 ENCOUNTER — Other Ambulatory Visit (INDEPENDENT_AMBULATORY_CARE_PROVIDER_SITE_OTHER): Payer: Self-pay | Admitting: Internal Medicine

## 2020-03-21 ENCOUNTER — Other Ambulatory Visit (INDEPENDENT_AMBULATORY_CARE_PROVIDER_SITE_OTHER): Payer: Self-pay | Admitting: Internal Medicine

## 2020-03-21 MED ORDER — METFORMIN HCL ER 500 MG PO TB24: 500.0000 mg | ORAL_TABLET | Freq: Two times a day (BID) | ORAL | 3 refills | Status: DC

## 2020-04-19 ENCOUNTER — Encounter (INDEPENDENT_AMBULATORY_CARE_PROVIDER_SITE_OTHER): Payer: Self-pay | Admitting: Internal Medicine

## 2020-04-19 ENCOUNTER — Other Ambulatory Visit: Payer: Self-pay

## 2020-04-19 ENCOUNTER — Ambulatory Visit (INDEPENDENT_AMBULATORY_CARE_PROVIDER_SITE_OTHER): Payer: BC Managed Care – PPO | Admitting: Internal Medicine

## 2020-04-19 VITALS — BP 100/70 | HR 71 | Temp 97.7°F | Ht 69.0 in | Wt 155.8 lb

## 2020-04-19 DIAGNOSIS — N943 Premenstrual tension syndrome: Secondary | ICD-10-CM

## 2020-04-19 DIAGNOSIS — E282 Polycystic ovarian syndrome: Secondary | ICD-10-CM | POA: Diagnosis not present

## 2020-04-19 DIAGNOSIS — R002 Palpitations: Secondary | ICD-10-CM

## 2020-04-19 DIAGNOSIS — I83813 Varicose veins of bilateral lower extremities with pain: Secondary | ICD-10-CM | POA: Diagnosis not present

## 2020-04-19 MED ORDER — METFORMIN HCL ER 500 MG PO TB24: 500.0000 mg | ORAL_TABLET | Freq: Two times a day (BID) | ORAL | 1 refills | Status: DC

## 2020-04-19 MED ORDER — PROGESTERONE 200 MG PO CAPS: 200.0000 mg | ORAL_CAPSULE | Freq: Every day | ORAL | 3 refills | Status: DC

## 2020-04-19 NOTE — Progress Notes (Signed)
Metrics: Intervention Frequency ACO  Documented Smoking Status Yearly  Screened one or more times in 24 months  Cessation Counseling or  Active cessation medication Past 24 months  Past 24 months   Guideline developer: UpToDate (See UpToDate for funding source) Date Released: 2014       Wellness Office Visit  Subjective:  Patient ID: Laura Morales, female    DOB: 1975/12/29  Age: 44 y.o. MRN: [id number]  CC: This lady was scheduled for an annual physical exam but she was delayed for appointment so we did a follow-up visit instead. She comes in for follow-up of her PCOS but she is also having symptoms around her cycle which sound very much like PMS. HPI  I will check blood work on the last visit and LH/FSH ratio is still confirm most likely diagnosis of PCOS.  She continues to and is now tolerating reasonably well Metformin.  She did not tolerate desiccated thyroid whatsoever and she does not want to take this as she is somewhat frightened about taking it. She is also complaining of varicose veins in her legs and she would like a referral to a vein specialist. Past Medical History:  Diagnosis Date  . Endometriosis   . Insulin resistance   . Other and unspecified ovarian cysts   . Papanicolaou smear of cervix with positive high risk human papilloma virus (HPV) test 11/09/2018  . PCOS (polycystic ovarian syndrome)   . SVT (supraventricular tachycardia) ([facility])   . Vaginal Pap smear, abnormal   . WPW (Wolff-Parkinson-White syndrome) 2000   Past Surgical History:  Procedure Laterality Date  . COLPOSCOPY    . DILATION AND CURETTAGE OF UTERUS    . KNEE ARTHROSCOPY    . LAPAROSCOPY    . TONSILLECTOMY       Family History  Problem Relation Age of Onset  . Hypotension Mother   . Epilepsy Mother   . Cancer Mother        lung  . Diabetes Father   . Glaucoma Father   . Multiple sclerosis Sister   . Glaucoma Brother   . Heart failure Maternal Grandmother   . Cystic fibrosis  Sister   . Seizures Sister   . Seizures Sister     Social History   Social History Narrative   Lives with boyfriend of 7-8 years.   Social History   Tobacco Use  . Smoking status: Current Every Day Smoker    Packs/day: 0.50    Types: Cigarettes    Start date: 11/07/1989  . Smokeless tobacco: Former Neurosurgeon    Types: Chew  Substance Use Topics  . Alcohol use: Yes    Alcohol/week: 3.0 standard drinks    Types: 3 Cans of beer per week    Comment: rarely    Current Meds  Medication Sig  . Melatonin 10 MG CAPS Take 10 mg by mouth daily.  . metFORMIN (GLUCOPHAGE-XR) 500 MG 24 hr tablet Take 1 tablet (500 mg total) by mouth 2 (two) times daily.  . Multiple Vitamin (MULTIVITAMIN) tablet Take by mouth daily. 2 gummies daily  . [DISCONTINUED] metFORMIN (GLUCOPHAGE-XR) 500 MG 24 hr tablet Take 1 tablet (500 mg total) by mouth 2 (two) times daily.       Depression screen [facility] 2/9 02/03/2020 11/20/2018 10/30/2018 10/30/2018  Decreased Interest 0 1 1 1   Down, Depressed, Hopeless 0 1 2 2   PHQ - 2 Score 0 2 3 3   Altered sleeping - 2 3 -  Tired, decreased energy -  1 2 -  Change in appetite - 1 3 -  Feeling bad or failure about yourself  - 1 2 -  Trouble concentrating - 1 1 -  Moving slowly or fidgety/restless - 1 3 -  Suicidal thoughts - 0 0 -  PHQ-9 Score - 9 17 -  Difficult doing work/chores - Somewhat difficult Very difficult -     Objective:   Today's Vitals: BP 100/70 (BP Location: Right Arm, Patient Position: Sitting, Cuff Size: Normal)   Pulse 71   Temp 97.7 F (36.5 C) (Temporal)   Ht 5\' 9"  (1.753 m)   Wt 155 lb 12.8 oz (70.7 kg)   SpO2 94%   BMI 23.01 kg/m  Vitals with BMI 04/19/2020 02/03/2020 01/13/2020  Height 5\' 9"  5\' 10"  5\' 10"   Weight 155 lbs 13 oz 158 lbs 6 oz 161 lbs 6 oz  BMI 23 78.24 23.53  Systolic 614 431 540  Diastolic 70 70 70  Pulse 71 76 74     Physical Exam  She looks systemically well.  Her weight is stable and infectious lost about 3 pounds  since the last visit.  Blood pressure is excellent.  Today, I did not examine her legs to look at the varicose veins.     Assessment   1. PCOS (polycystic ovarian syndrome)   2. Palpitations   3. PMS (premenstrual syndrome)   4. Varicose veins of both lower extremities with pain       Tests ordered Orders Placed This Encounter  Procedures  . Ambulatory referral to Vascular Surgery     Plan: 1. As far as her PMS is concerned, she is now agreeable to try progesterone around the time of her cycle and in the evening. 2. I have refilled her Metformin for PCOS. 3. I will refer to vein specialist for the presumed varicose veins. 4. Follow-up in about 6 weeks to see how she is doing.   Meds ordered this encounter  Medications  . metFORMIN (GLUCOPHAGE-XR) 500 MG 24 hr tablet    Sig: Take 1 tablet (500 mg total) by mouth 2 (two) times daily.    Dispense:  180 tablet    Refill:  1  . progesterone (PROMETRIUM) 200 MG capsule    Sig: Take 1 capsule (200 mg total) by mouth daily.    Dispense:  30 capsule    Refill:  3    Win Guajardo Luther Parody, MD

## 2020-04-20 ENCOUNTER — Other Ambulatory Visit (INDEPENDENT_AMBULATORY_CARE_PROVIDER_SITE_OTHER): Payer: Self-pay

## 2020-04-20 MED ORDER — METFORMIN HCL ER 500 MG PO TB24: 500.0000 mg | ORAL_TABLET | Freq: Two times a day (BID) | ORAL | 1 refills | Status: DC

## 2020-05-25 ENCOUNTER — Ambulatory Visit (INDEPENDENT_AMBULATORY_CARE_PROVIDER_SITE_OTHER): Payer: BC Managed Care – PPO | Admitting: Internal Medicine

## 2020-07-02 ENCOUNTER — Emergency Department (HOSPITAL_COMMUNITY)
Admission: EM | Admit: 2020-07-02 | Discharge: 2020-07-02 | Disposition: A | Discharge status: Home / Self Care | Payer: BC Managed Care – PPO | Attending: Emergency Medicine | Admitting: Emergency Medicine

## 2020-07-02 ENCOUNTER — Other Ambulatory Visit: Payer: Self-pay

## 2020-07-02 ENCOUNTER — Encounter (HOSPITAL_COMMUNITY): Payer: Self-pay

## 2020-07-02 DIAGNOSIS — Z5321 Procedure and treatment not carried out due to patient leaving prior to being seen by health care provider: Secondary | ICD-10-CM | POA: Insufficient documentation

## 2020-07-02 DIAGNOSIS — T7840XA Allergy, unspecified, initial encounter: Secondary | ICD-10-CM | POA: Diagnosis not present

## 2020-07-02 NOTE — ED Triage Notes (Signed)
Pt arrived via POV from home c/o possible allergic reaction from unknown source. Pt reports being outside weed eating yesterday and noticed itchy bumps on chest and reports scratching some of the heads of the bumps off due to irritation. Upon initial assessment Pt reports possibly being exposed to some kind of poisonous plant possibly as a source of irritation.

## 2020-07-02 NOTE — ED Notes (Signed)
Patient left and was advised to come back if any problems. Patient stated they were living and were fine.

## 2020-07-07 ENCOUNTER — Encounter (INDEPENDENT_AMBULATORY_CARE_PROVIDER_SITE_OTHER): Payer: Self-pay | Admitting: Internal Medicine

## 2021-08-17 ENCOUNTER — Encounter: Payer: BC Managed Care – PPO | Admitting: Obstetrics & Gynecology

## 2021-09-14 ENCOUNTER — Encounter: Payer: BC Managed Care – PPO | Admitting: Obstetrics & Gynecology

## 2021-10-12 ENCOUNTER — Other Ambulatory Visit (HOSPITAL_COMMUNITY)
Admission: RE | Admit: 2021-10-12 | Discharge: 2021-10-12 | Disposition: A | Discharge status: Home / Self Care | Payer: BC Managed Care – PPO | Source: Ambulatory Visit | Attending: Obstetrics & Gynecology | Admitting: Obstetrics & Gynecology

## 2021-10-12 ENCOUNTER — Other Ambulatory Visit: Payer: Self-pay

## 2021-10-12 ENCOUNTER — Ambulatory Visit: Payer: BC Managed Care – PPO | Admitting: Adult Health

## 2021-10-12 ENCOUNTER — Encounter: Payer: Self-pay | Admitting: Adult Health

## 2021-10-12 VITALS — BP 125/85 | HR 81 | Ht 69.0 in | Wt 180.0 lb

## 2021-10-12 DIAGNOSIS — Z3202 Encounter for pregnancy test, result negative: Secondary | ICD-10-CM

## 2021-10-12 DIAGNOSIS — Z124 Encounter for screening for malignant neoplasm of cervix: Secondary | ICD-10-CM | POA: Insufficient documentation

## 2021-10-12 DIAGNOSIS — Z1231 Encounter for screening mammogram for malignant neoplasm of breast: Secondary | ICD-10-CM | POA: Insufficient documentation

## 2021-10-12 DIAGNOSIS — N930 Postcoital and contact bleeding: Secondary | ICD-10-CM | POA: Diagnosis not present

## 2021-10-12 DIAGNOSIS — N926 Irregular menstruation, unspecified: Secondary | ICD-10-CM | POA: Diagnosis not present

## 2021-10-12 DIAGNOSIS — N888 Other specified noninflammatory disorders of cervix uteri: Secondary | ICD-10-CM

## 2021-10-12 DIAGNOSIS — Z8742 Personal history of other diseases of the female genital tract: Secondary | ICD-10-CM

## 2021-10-12 DIAGNOSIS — N946 Dysmenorrhea, unspecified: Secondary | ICD-10-CM

## 2021-10-12 DIAGNOSIS — N941 Unspecified dyspareunia: Secondary | ICD-10-CM

## 2021-10-12 LAB — POCT URINE PREGNANCY: Preg Test, Ur: NEGATIVE

## 2021-10-12 MED ORDER — DOXYCYCLINE HYCLATE 100 MG PO TABS: 100.0000 mg | ORAL_TABLET | Freq: Two times a day (BID) | ORAL | 0 refills | Status: DC

## 2021-10-12 NOTE — Progress Notes (Signed)
  Subjective:     Patient ID: Laura Morales, female   DOB: 19-Aug-1976, 45 y.o.   MRN: [id number]  HPI Laura Morales is a 45 year old white female, single, G5P0050, in complaining of irregular bleeding and bleeds after sex, pain with sex and menstrual cramping and will bleed if stressed. Lab Results  Component Value Date   DIAGPAP  10/30/2018    NEGATIVE FOR INTRAEPITHELIAL LESIONS OR MALIGNANCY.   HPV DETECTED (A) 10/30/2018   Will perform pap today while here.  No current PCP   Review of Systems Irregular bleeding Menstrual cramps Pain with sex and bleeds after sex Has hx endometriosis and PCO,she says  No new sex partners  Reviewed past medical,surgical, social and family history. Reviewed medications and allergies.     Objective:   Physical Exam BP 125/85 (BP Location: Left Arm, Patient Position: Sitting, Cuff Size: Normal)   Pulse 81   Ht 5\' 9"  (1.753 m)   Wt 180 lb (81.6 kg)   LMP 10/11/2021   BMI 26.58 kg/m     UPT is negative.Skin warm and dry.Pelvic: external genitalia is normal in appearance no lesions, vagina: pink and moist,urethra has no lesions or masses noted, cervix has multiple nabothian cysts,tender with pap, pap with HR HPV genotyping performed,no bleeding,  uterus: normal size, shape and contour, non tender, no masses felt, adnexa: no masses or tenderness noted. Bladder is non tender and no masses felt.  Fall risk is low  Upstream - 10/12/21 1009       Pregnancy Intention Screening   Does the patient want to become pregnant in the next year? No    Does the patient's partner want to become pregnant in the next year? No    Would the patient like to discuss contraceptive options today? No      Contraception Wrap Up   Current Method Withdrawal or Other Method    End Method Withdrawal or Other Method             Examination chaperoned by 14/02/22 LPN  Assessment:     1. Pregnancy examination or test, negative result   2. Routine Papanicolaou  smear Pap sent   3. Irregular bleeding Will rx doxycycline  Meds ordered this encounter  Medications   doxycycline (VIBRA-TABS) 100 MG tablet    Sig: Take 1 tablet (100 mg total) by mouth 2 (two) times daily.    Dispense:  20 tablet    Refill:  0    Order Specific Question:   Supervising Provider    Answer:   Malachy Mood H [2510]   Follow up in 2 weeks   4. Postcoital bleeding   5. Dyspareunia in female   6. Menstrual cramp   7. Nabothian cyst   8. Screening mammogram for breast cancer Mammogram scheduled for her 10/19/21 at 8 am at [facility]   9. History of endometriosis     Plan:     Return in 2 weeks for recheck and ROS

## 2021-10-12 NOTE — Addendum Note (Signed)
Addended by: Colen Darling on: 10/12/2021 10:44 AM   Modules accepted: Orders

## 2021-10-16 ENCOUNTER — Encounter: Payer: Self-pay | Admitting: Adult Health

## 2021-10-16 DIAGNOSIS — R87610 Atypical squamous cells of undetermined significance on cytologic smear of cervix (ASC-US): Secondary | ICD-10-CM

## 2021-10-16 HISTORY — DX: Atypical squamous cells of undetermined significance on cytologic smear of cervix (ASC-US): R87.610

## 2021-10-16 LAB — CYTOLOGY - PAP
Chlamydia: NEGATIVE
Comment: NEGATIVE
Comment: NEGATIVE
Comment: NORMAL
Diagnosis: UNDETERMINED — AB
High risk HPV: NEGATIVE
Neisseria Gonorrhea: NEGATIVE

## 2021-10-19 ENCOUNTER — Other Ambulatory Visit: Payer: Self-pay

## 2021-10-19 ENCOUNTER — Ambulatory Visit (HOSPITAL_COMMUNITY)
Admission: RE | Admit: 2021-10-19 | Discharge: 2021-10-19 | Disposition: A | Discharge status: Home / Self Care | Payer: BC Managed Care – PPO | Source: Ambulatory Visit | Attending: Adult Health | Admitting: Adult Health

## 2021-10-19 DIAGNOSIS — Z1231 Encounter for screening mammogram for malignant neoplasm of breast: Secondary | ICD-10-CM | POA: Insufficient documentation

## 2021-10-22 ENCOUNTER — Other Ambulatory Visit (HOSPITAL_COMMUNITY): Payer: Self-pay | Admitting: Adult Health

## 2021-10-22 DIAGNOSIS — R921 Mammographic calcification found on diagnostic imaging of breast: Secondary | ICD-10-CM

## 2021-10-22 DIAGNOSIS — R928 Other abnormal and inconclusive findings on diagnostic imaging of breast: Secondary | ICD-10-CM

## 2021-10-26 ENCOUNTER — Other Ambulatory Visit: Payer: Self-pay

## 2021-10-26 ENCOUNTER — Telehealth: Payer: BC Managed Care – PPO | Admitting: Adult Health

## 2021-11-01 ENCOUNTER — Ambulatory Visit: Payer: BC Managed Care – PPO | Admitting: Adult Health

## 2021-11-01 ENCOUNTER — Encounter: Payer: Self-pay | Admitting: Adult Health

## 2021-11-01 ENCOUNTER — Other Ambulatory Visit: Payer: Self-pay

## 2021-11-01 VITALS — BP 104/66 | HR 72 | Ht 70.0 in | Wt 186.0 lb

## 2021-11-01 DIAGNOSIS — N926 Irregular menstruation, unspecified: Secondary | ICD-10-CM | POA: Diagnosis not present

## 2021-11-01 DIAGNOSIS — R102 Pelvic and perineal pain: Secondary | ICD-10-CM | POA: Diagnosis not present

## 2021-11-01 DIAGNOSIS — N888 Other specified noninflammatory disorders of cervix uteri: Secondary | ICD-10-CM | POA: Diagnosis not present

## 2021-11-01 MED ORDER — MEGESTROL ACETATE 40 MG PO TABS: ORAL_TABLET | ORAL | 3 refills | Status: DC

## 2021-11-01 NOTE — Progress Notes (Signed)
°  Subjective:     Patient ID: Laura Morales, female   DOB: 1976-01-22, 45 y.o.   MRN: [id number]  HPI Deane is a 45 year old white female,single, G5P0050, back in follow up on taking doxycycline for bleeding and did helped, is having pain in vagina. Lab Results  Component Value Date   DIAGPAP (A) 10/12/2021    - Atypical squamous cells of undetermined significance (ASC-US)   HPV DETECTED (A) 10/30/2018   HPVHIGH Negative 10/12/2021     Review of Systems Bleeding better Pain in vagina Sex is not comfortable  Reviewed past medical,surgical, social and family history. Reviewed medications and allergies.     Objective:   Physical Exam BP 104/66 (BP Location: Left Arm, Patient Position: Sitting, Cuff Size: Normal)    Pulse 72    Ht 5\' 10"  (1.778 m)    Wt 186 lb (84.4 kg)    LMP 10/11/2021    BMI 26.69 kg/m     Skin warm and dry.Pelvic: external genitalia is normal in appearance no lesions, vagina: pink and moist,vagina tender,urethra has no lesions or masses noted, cervix:several nabothian cysts,uterus: normal size, shape and contour, non tender, no masses felt, adnexa: no masses or tenderness noted. Bladder is non tender and no masses felt  Fall risk is low  Upstream - 11/01/21 0914       Pregnancy Intention Screening   Does the patient want to become pregnant in the next year? No    Does the patient's partner want to become pregnant in the next year? No    Would the patient like to discuss contraceptive options today? No      Contraception Wrap Up   Current Method Withdrawal or Other Method    End Method Withdrawal or Other Method    Contraception Counseling Provided No            Examination chaperoned by 11/03/21 LPN  Assessment:     1. Irregular bleeding Will try megace Meds ordered this encounter  Medications   megestrol (MEGACE) 40 MG tablet    Sig: Take 2 daily    Dispense:  60 tablet    Refill:  3    Order Specific Question:   Supervising Provider     Answer:   Malachy Mood, LUTHER H [[id number]]     2. Nabothian cyst   3. Vaginal pain Will try megace Plan:     Follow up in 3 months

## 2021-11-13 ENCOUNTER — Encounter (HOSPITAL_COMMUNITY): Payer: Self-pay

## 2021-11-13 ENCOUNTER — Other Ambulatory Visit: Payer: Self-pay

## 2021-11-13 ENCOUNTER — Ambulatory Visit (HOSPITAL_COMMUNITY)
Admission: RE | Admit: 2021-11-13 | Discharge: 2021-11-13 | Disposition: A | Discharge status: Home / Self Care | Payer: BC Managed Care – PPO | Source: Ambulatory Visit | Attending: Adult Health | Admitting: Adult Health

## 2021-11-13 DIAGNOSIS — R921 Mammographic calcification found on diagnostic imaging of breast: Secondary | ICD-10-CM

## 2021-11-13 DIAGNOSIS — R928 Other abnormal and inconclusive findings on diagnostic imaging of breast: Secondary | ICD-10-CM | POA: Insufficient documentation

## 2022-01-31 ENCOUNTER — Ambulatory Visit: Payer: BC Managed Care – PPO | Admitting: Adult Health

## 2022-02-05 ENCOUNTER — Ambulatory Visit: Payer: BC Managed Care – PPO | Admitting: Adult Health

## 2022-02-05 ENCOUNTER — Other Ambulatory Visit: Payer: Self-pay

## 2022-02-05 ENCOUNTER — Encounter: Payer: Self-pay | Admitting: Adult Health

## 2022-02-05 VITALS — BP 124/76 | HR 69 | Ht 70.0 in | Wt 185.0 lb

## 2022-02-05 DIAGNOSIS — N946 Dysmenorrhea, unspecified: Secondary | ICD-10-CM | POA: Diagnosis not present

## 2022-02-05 DIAGNOSIS — N926 Irregular menstruation, unspecified: Secondary | ICD-10-CM

## 2022-02-05 DIAGNOSIS — Z8742 Personal history of other diseases of the female genital tract: Secondary | ICD-10-CM

## 2022-02-05 DIAGNOSIS — R102 Pelvic and perineal pain: Secondary | ICD-10-CM | POA: Diagnosis not present

## 2022-02-05 DIAGNOSIS — R5383 Other fatigue: Secondary | ICD-10-CM

## 2022-02-05 MED ORDER — MYFEMBREE 40-1-0.5 MG PO TABS: ORAL_TABLET | ORAL | 0 refills | Status: DC

## 2022-02-05 NOTE — Progress Notes (Signed)
?  Subjective:  ?  ? Patient ID: Laura Morales, female   DOB: 02/10/1976, 46 y.o.   MRN: [id number] ? ?HPI ?Laura Morales is a 46 year old white female,single, G5P0050 back in follow up on trying megace to stop bleeding and it did not work, she stopped it 4 days ago. ?Lab Results  ?Component Value Date  ? DIAGPAP (A) 10/12/2021  ?  - Atypical squamous cells of undetermined significance (ASC-US)  ? HPV DETECTED (A) 10/30/2018  ? [place] Negative 10/12/2021  ?  ? ?Review of Systems ?Bleeding almost daily on megace and it did not help with pain and cramps ?Feels tired and cold at times ? ?Reviewed past medical,surgical, social and family history. Reviewed medications and allergies.  ?   ?Objective:  ? Physical Exam ?BP 124/76 (BP Location: Left Arm, Patient Position: Sitting, Cuff Size: Normal)   Pulse 69   Ht 5\' 10"  (1.778 m)   Wt 185 lb (83.9 kg)   BMI 26.54 kg/m?   ?  Skin warm and dry. Lungs: clear to ausculation bilaterally. Cardiovascular: regular rate and rhythm.  ?Fall risk is low ? Upstream - 02/05/22 1143   ? ?  ? Pregnancy Intention Screening  ? Does the patient want to become pregnant in the next year? No   ? Does the patient's partner want to become pregnant in the next year? No   ? Would the patient like to discuss contraceptive options today? No   ?  ? Contraception Wrap Up  ? Current Method Withdrawal or Other Method   ? End Method Withdrawal or Other Method   ? ?  ?  ? ?  ?  ?Assessment:  ?   ?1. Irregular bleeding ?Will check labs ?- gave handout on ablation ?- CBC ?- TSH ?- T4, free ?- T3, free ? ?2. Vaginal pain ? ?3. History of endometriosis ?She smokes so can not take myfrembree  ? ?4. Menstrual cramp ? ?5. Tired ?Will check labs  ?- CBC ?- Comprehensive metabolic panel ?- TSH ?- T4, free ?- T3, free  ?   ?Plan:  ?   ?Follow up in 4 weeks  ?   ?

## 2022-02-06 LAB — COMPREHENSIVE METABOLIC PANEL
ALT: 20 IU/L (ref 0–32)
AST: 19 IU/L (ref 0–40)
Albumin/Globulin Ratio: 2 (ref 1.2–2.2)
Albumin: 4.7 g/dL (ref 3.8–4.8)
Alkaline Phosphatase: 58 IU/L (ref 44–121)
BUN/Creatinine Ratio: 10 (ref 9–23)
BUN: 8 mg/dL (ref 6–24)
Bilirubin Total: 0.4 mg/dL (ref 0.0–1.2)
CO2: 24 mmol/L (ref 20–29)
Calcium: 9.7 mg/dL (ref 8.7–10.2)
Chloride: 102 mmol/L (ref 96–106)
Creatinine, Ser: 0.82 mg/dL (ref 0.57–1.00)
Globulin, Total: 2.4 g/dL (ref 1.5–4.5)
Glucose: 59 mg/dL — ABNORMAL LOW (ref 70–99)
Potassium: 4.2 mmol/L (ref 3.5–5.2)
Sodium: 141 mmol/L (ref 134–144)
Total Protein: 7.1 g/dL (ref 6.0–8.5)
eGFR: 91 mL/min/{1.73_m2} (ref >59)

## 2022-02-06 LAB — CBC
Hematocrit: 41.1 % (ref 34.0–46.6)
Hemoglobin: 13.9 g/dL (ref 11.1–15.9)
MCH: 31 pg (ref 26.6–33.0)
MCHC: 33.8 g/dL (ref 31.5–35.7)
MCV: 92 fL (ref 79–97)
Platelets: 255 10*3/uL (ref 150–450)
RBC: 4.48 x10E6/uL (ref 3.77–5.28)
RDW: 12.3 % (ref 11.7–15.4)
WBC: 7.8 10*3/uL (ref 3.4–10.8)

## 2022-02-06 LAB — TSH: TSH: 1.11 u[IU]/mL (ref 0.450–4.500)

## 2022-02-06 LAB — T3, FREE: T3, Free: 2.8 pg/mL (ref 2.0–4.4)

## 2022-02-06 LAB — T4, FREE: Free T4: 1.05 ng/dL (ref 0.82–1.77)

## 2022-02-15 NOTE — Progress Notes (Incomplete)
?[facility] - Clinic Note ? ?02/18/2022 ? ?  ? ?CHIEF COMPLAINT ?Patient presents for No chief complaint on file. ? ? ?HISTORY OF PRESENT ILLNESS: ?Laura Morales is a 46 y.o. female who presents to the clinic today for:  ? ? ? ?Referring physician: ?No referring provider defined for this encounter. ? ?HISTORICAL INFORMATION:  ? ?Selected notes from the MEDICAL RECORD NUMBER ?Referred by Dr. Marland KitchenLEE:  ?Ocular Hx- ?PMH- ?  ? ?CURRENT MEDICATIONS: ?No current outpatient medications on file. (Ophthalmic Drugs)  ? ?No current facility-administered medications for this visit. (Ophthalmic Drugs)  ? ?Current Outpatient Medications (Other)  ?Medication Sig  ? Multiple Vitamin (MULTIVITAMIN) tablet Take by mouth daily. 2 gummies daily  ? ?No current facility-administered medications for this visit. (Other)  ? ? ? ? ?REVIEW OF SYSTEMS: ? ? ? ?ALLERGIES ?Allergies  ?Allergen Reactions  ? Codeine Nausea And Vomiting  ?  All pain meds  ? ? ?PAST MEDICAL HISTORY ?Past Medical History:  ?Diagnosis Date  ? ASCUS of cervix with negative high risk HPV 10/16/2021  ? 10/16/21 repeat pap in 1 year, per [facility], 5 year risk for CIN 3+ is 2.6%  ? Endometriosis   ? Insulin resistance   ? Other and unspecified ovarian cysts   ? Papanicolaou smear of cervix with positive high risk human papilloma virus (HPV) test 11/09/2018  ? PCOS (polycystic ovarian syndrome)   ? SVT (supraventricular tachycardia) ([facility])   ? Vaginal Pap smear, abnormal   ? WPW (Wolff-Parkinson-White syndrome) 2000  ? ?Past Surgical History:  ?Procedure Laterality Date  ? COLPOSCOPY    ? DILATION AND CURETTAGE OF UTERUS    ? KNEE ARTHROSCOPY    ? LAPAROSCOPY    ? TONSILLECTOMY    ? ? ?FAMILY HISTORY ?Family History  ?Problem Relation Age of Onset  ? Hypotension Mother   ? Epilepsy Mother   ? Cancer Mother   ?     lung  ? Diabetes Father   ? Glaucoma Father   ? Multiple sclerosis Sister   ? Glaucoma Brother   ? Heart failure Maternal Grandmother   ? Cystic  fibrosis Sister   ? Seizures Sister   ? Seizures Sister   ? ? ?SOCIAL HISTORY ?Social History  ? ?Tobacco Use  ? Smoking status: Every Day  ?  Packs/day: 0.50  ?  Years: 30.00  ?  Pack years: 15.00  ?  Types: Cigarettes  ?  Start date: 11/07/1989  ? Smokeless tobacco: Former  ?  Types: Chew  ?Vaping Use  ? Vaping Use: Never used  ?Substance Use Topics  ? Alcohol use: Yes  ?  Alcohol/week: 3.0 standard drinks  ?  Types: 3 Cans of beer per week  ?  Comment: rarely  ? Drug use: No  ? ?  ? ?  ? ?OPHTHALMIC EXAM: ? ?Not recorded ?  ? ? ?IMAGING AND PROCEDURES  ?Imaging and Procedures for 02/18/2022 ? ? ? ?  ?  ? ?  ?ASSESSMENT/PLAN: ? ?No diagnosis found. ? ?1. ? ?2. ? ?3. ? ?Ophthalmic Meds Ordered this visit:  ?No orders of the defined types were placed in this encounter. ? ? ?  ? ?No follow-ups on file. ? ?There are no Patient Instructions on file for this visit. ? ? ?Explained the diagnoses, plan, and follow up with the patient and they expressed understanding.  Patient expressed understanding of the importance of proper follow up care.  ? ?This document serves as  a record of services personally performed by Karie Chimera, MD, PhD. It was created on their behalf by Annalee Genta, COMT. The creation of this record is the provider's dictation and/or activities during the visit. ? ?Electronically signed by: Annalee Genta, COMT 02/15/22 10:06 AM ? ? ? ?Karie Chimera, M.D., Ph.D. ?Diseases & Surgery of the Retina and Vitreous ?[facility] ?@TODAY @ ? ? ? ? ?Abbreviations: ?M myopia (nearsighted); A astigmatism; H hyperopia (farsighted); P presbyopia; Mrx spectacle prescription;  CTL contact lenses; OD right eye; OS left eye; OU both eyes  XT exotropia; ET esotropia; PEK punctate epithelial keratitis; PEE punctate epithelial erosions; DES dry eye syndrome; MGD meibomian gland dysfunction; ATs artificial tears; PFAT's preservative free artificial tears; NSC nuclear sclerotic cataract; PSC posterior  subcapsular cataract; ERM epi-retinal membrane; PVD posterior vitreous detachment; RD retinal detachment; DM diabetes mellitus; DR diabetic retinopathy; NPDR non-proliferative diabetic retinopathy; PDR proliferative diabetic retinopathy; CSME clinically significant macular edema; DME diabetic macular edema; dbh dot blot hemorrhages; CWS cotton wool spot; POAG primary open angle glaucoma; C/D cup-to-disc ratio; HVF humphrey visual field; GVF goldmann visual field; OCT optical coherence tomography; IOP intraocular pressure; BRVO Branch retinal vein occlusion; CRVO central retinal vein occlusion; CRAO central retinal artery occlusion; BRAO branch retinal artery occlusion; RT retinal tear; SB scleral buckle; PPV pars plana vitrectomy; VH Vitreous hemorrhage; PRP panretinal laser photocoagulation; IVK intravitreal kenalog; VMT vitreomacular traction; MH Macular hole;  NVD neovascularization of the disc; NVE neovascularization elsewhere; AREDS age related eye disease study; ARMD age related macular degeneration; POAG primary open angle glaucoma; EBMD epithelial/anterior basement membrane dystrophy; ACIOL anterior chamber intraocular lens; IOL intraocular lens; PCIOL posterior chamber intraocular lens; Phaco/IOL phacoemulsification with intraocular lens placement; PRK photorefractive keratectomy; LASIK laser assisted in situ keratomileusis; HTN hypertension; DM diabetes mellitus; COPD chronic obstructive pulmonary disease ? ?

## 2022-02-18 ENCOUNTER — Ambulatory Visit (INDEPENDENT_AMBULATORY_CARE_PROVIDER_SITE_OTHER): Payer: BC Managed Care – PPO | Admitting: Ophthalmology

## 2022-02-18 ENCOUNTER — Encounter (INDEPENDENT_AMBULATORY_CARE_PROVIDER_SITE_OTHER): Payer: Self-pay | Admitting: Ophthalmology

## 2022-02-18 DIAGNOSIS — H3581 Retinal edema: Secondary | ICD-10-CM

## 2022-02-18 DIAGNOSIS — H04123 Dry eye syndrome of bilateral lacrimal glands: Secondary | ICD-10-CM

## 2022-02-18 DIAGNOSIS — H53002 Unspecified amblyopia, left eye: Secondary | ICD-10-CM

## 2022-02-18 NOTE — Progress Notes (Signed)
?[facility] - Clinic Note ? ?02/18/2022 ? ?  ? ?CHIEF COMPLAINT ?Patient presents for Retina Evaluation and Eye Problem ? ? ?HISTORY OF PRESENT ILLNESS: ?Laura Morales is a 45 y.o. female who presents to the clinic today for:  ? ?HPI   ? ? Retina Evaluation   ?In both eyes (Hemorrhages OU per Dr. Ethelene Hal ([facility]  Associated Symptoms Flashes, Floaters and Pain (When reading the lines on the page run together. ).  Context:  distance vision, mid-range vision, near vision and reading (When uses glasses the vision clears up. ).  I, the attending physician,  performed the HPI with the patient and updated documentation appropriately. ? ?  ?  ? ? 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 The tumor was not surgically removed and states that the tumor healed itself. She is a Psychologist, occupational and wears eye protection all the time. She has had a history of getting metal in both eyes.  ? ?  ?  ?Last edited by Rennis Chris, MD on 02/18/2022 11:07 AM.  ?  ?Pt is here on the referral of Dr. Ethelene Hal at [facility] in [place], pt states she went to see him to get to safety glasses, but ended up needed regular glasses, pt states Dr. Ethelene Hal was not in the office, so he was unable to dilate her, but thought he saw bleeding in both eyes, pt has hx of optic nerve glioma, pt states she has never been able to see out of the left eye, but a few years ago she fell and hit her head and was suddenly able to see colors, pt states afterwards she went to [facility] and they ran several tests and stated that her left eye would not get any better than it is, pt denies being diabetic or hypertensive, she states she has low blood pressure ? ?Referring physician: ?Pllc, [facility] ?[address] ?[place],  [place] [address] ? ?HISTORICAL INFORMATION:  ? ?Selected notes from the MEDICAL RECORD NUMBER ?Referred by Dr. Ethelene Hal ([facility]) for concern of retinal hemes OU ?LEE:  ?Ocular Hx- ?PMH- ?  ? ?CURRENT MEDICATIONS: ?No current outpatient medications on file. (Ophthalmic Drugs)  ? ?No current facility-administered medications for this visit. (Ophthalmic Drugs)  ? ?Current Outpatient Medications (Other)  ?Medication Sig  ? Multiple Vitamin (MULTIVITAMIN) tablet Take by mouth daily. 2 gummies daily  ? ?No current facility-administered medications for this visit. (Other)  ? ?REVIEW OF SYSTEMS: ?ROS   ?Positive for: Cardiovascular, Eyes ?Last edited by Julieanne Cotton, COT on 02/18/2022  9:21 AM.  ?  ? ?ALLERGIES ?Allergies  ?Allergen Reactions  ? Codeine Nausea And Vomiting  ?  All pain meds  ? ?PAST MEDICAL HISTORY ?Past Medical History:  ?Diagnosis Date  ? ASCUS of cervix with negative high risk HPV 10/16/2021  ? 10/16/21 repeat pap in 1 year, per [facility], 5 year risk for CIN 3+ is 2.6%  ? Corneal abrasion   ? Endometriosis   ? Insulin resistance   ? Other and unspecified ovarian cysts   ? Papanicolaou smear of cervix with positive high risk human papilloma virus (HPV) test 11/09/2018  ? PCOS (polycystic ovarian syndrome)   ? Retinal hemorrhage   ? SVT (supraventricular tachycardia) (HCC)   ? Vaginal Pap smear, abnormal   ? WPW (Wolff-Parkinson-White syndrome) 2000  ? ?Past  Surgical History:  ?Procedure Laterality Date  ? COLPOSCOPY    ? DILATION AND CURETTAGE OF UTERUS    ? KNEE ARTHROSCOPY    ? LAPAROSCOPY    ? RETINAL DETACHMENT SURGERY    ? TONSILLECTOMY    ? ?FAMILY HISTORY ?Family History  ?Problem Relation Age of Onset  ? Hypotension Mother   ? Epilepsy Mother   ? Cancer Mother   ?     lung  ? Diabetes Father   ? Glaucoma Father   ? Multiple sclerosis Sister   ? Glaucoma Brother   ? Heart failure Maternal Grandmother   ? Cystic fibrosis Sister   ? Seizures Sister   ? Seizures Sister   ? ?SOCIAL HISTORY ?Social History  ? ?Tobacco Use  ? Smoking status: Every Day  ?  Packs/day: 0.50  ?  Years: 30.00  ?  Pack years: 15.00  ?   Types: Cigarettes  ?  Start date: 11/07/1989  ? Smokeless tobacco: Former  ?  Types: Chew  ?Vaping Use  ? Vaping Use: Never used  ?Substance Use Topics  ? Alcohol use: Yes  ?  Alcohol/week: 3.0 standard drinks  ?  Types: 3 Cans of beer per week  ?  Comment: rarely  ? Drug use: No  ?  ? ?  ?OPHTHALMIC EXAM: ? ?Base Eye Exam   ? ? Visual Acuity (Snellen - Linear)   ? ?   Right Left  ? Dist cc 20/20 +1 20/60 +2  ? Dist ph cc  NI  ? ? Correction: Glasses  ? ?  ?  ? ? Tonometry (Tonopen, 9:32 AM)   ? ?   Right Left  ? Pressure 18 18  ? ?  ?  ? ? Pupils   ? ?   Pupils Dark Light Shape React APD  ? Right PERRL 4 3 Round Brisk None  ? Left PERRL 4 3 Round Brisk None  ? ?  ?  ? ? Visual Fields   ? ?   Left Right  ?   Full  ? Restrictions Partial outer superior temporal, superior nasal deficiencies   ? ?  ?  ? ? Extraocular Movement   ? ?   Right Left  ?  Full Full  ? ?  ?  ? ? Neuro/Psych   ? ? Oriented x3: Yes  ? ?  ?  ? ? Dilation   ? ? Both eyes: 1.0% Mydriacyl, 2.5% Phenylephrine @ 9:30 AM  ? ?  ?  ? ?  ? ?Slit Lamp and Fundus Exam   ? ? Slit Lamp Exam   ? ?   Right Left  ? Lids/Lashes Dermatochalasis - upper lid Dermatochalasis - upper lid  ? Conjunctiva/Sclera White and quiet White and quiet  ? Cornea Mild tear film debris, 1-2+ Punctate epithelial erosions Mild tear film debris, 1-2+ Punctate epithelial erosions  ? Anterior Chamber Deep and quiet Deep and quiet  ? Iris Round and dilated Round and dilated  ? Lens Clear Clear  ? Anterior Vitreous clear clear  ? ?  ?  ? ? Fundus Exam   ? ?   Right Left  ? Disc Pink and Sharp, Compact Pink and Sharp, mild PPP  ? C/D Ratio 0.2 0.5  ? Macula Flat, Good foveal reflex, No heme or edema Flat, Good foveal reflex, No heme or edema, 2 punctate refractile deposits (1 IN to fovea, 1 ST to fovea)  ? Vessels mild attenuation, mild  tortuosity mild attenuation, mild tortuosity, AV crossing changes  ? Periphery Attached, ?very shallow schisis cavity at 0700; no heme Attached; no heme   ? ?  ?  ? ?  ? ?Refraction   ? ? Wearing Rx   ? ?   Sphere Cylinder Axis Add  ? Right -0.25 -1.75 091 +1.25  ? Left -0.25 -1.50 083 +1.25  ? ? Age: 76m  ?02/2022 New rx. ? ?  ?  ? ? Manifest Refraction   ? ?   Sphere Cylinder Axis Dist VA Add  ? Right -0.25 -1.75 091 20/20 +1.25  ? Left -0.25 -1.50 083 20/60+2 +1.25  ? ?  ?  ? ?  ? ? ?IMAGING AND PROCEDURES  ?Imaging and Procedures for 02/18/2022 ? ?OCT, Retina - OU - Both Eyes   ? ?   ?Right Eye ?Quality was good. Central Foveal Thickness: 240. Progression has no prior data. Findings include normal foveal contour, no IRF, no SRF.  ? ?Left Eye ?Quality was good. Central Foveal Thickness: 250. Progression has no prior data. Findings include normal foveal contour, no IRF, no SRF.  ? ?Notes ?*Images captured and stored on drive ? ?Diagnosis / Impression:  ?NFP, no IRF/SRF OU ? ?Clinical management:  ?See below ? ?Abbreviations: NFP - Normal foveal profile. CME - cystoid macular edema. PED - pigment epithelial detachment. IRF - intraretinal fluid. SRF - subretinal fluid. EZ - ellipsoid zone. ERM - epiretinal membrane. ORA - outer retinal atrophy. ORT - outer retinal tubulation. SRHM - subretinal hyper-reflective material. IRHM - intraretinal hyper-reflective material ? ? ?  ?  ?  ? ?  ?ASSESSMENT/PLAN: ? ?  ICD-10-CM   ?1. Amblyopia of left eye  H53.002   ?  ?2. Retinal edema  H35.81 OCT, Retina - OU - Both Eyes  ?  ?3. Dry eyes  H04.123   ?  ? ?1,2. Amblyopia OS ? - pt reports history of ?tumor on optic disc or nerve OS ?optic nerve glioma ? - reports history of patching as a child, but OS has always remained weaker ? - pt reports history of extensive work up at [facility] about 5 yrs ago (~2018) ? - BCVA 20/60 OS ? - dilated exam normal -- no hemorrhages or edema ?- pt is cleared from a retina standpoint for release to [facility] and resumption of primary eye care ? ?3. Dry eyes OU ?- recommend artificial tears and lubricating ointment as needed ? ?Ophthalmic Meds  Ordered this visit:  ?No orders of the defined types were placed in this encounter. ?  ? ?Return if symptoms worsen or fail to improve. ? ?There are no Patient Instructions on file for this visit. ? ? ?Explai

## 2022-03-04 ENCOUNTER — Ambulatory Visit
Admission: EM | Admit: 2022-03-04 | Discharge: 2022-03-04 | Disposition: A | Discharge status: Home / Self Care | Payer: BC Managed Care – PPO

## 2022-03-04 DIAGNOSIS — J069 Acute upper respiratory infection, unspecified: Secondary | ICD-10-CM

## 2022-03-04 MED ORDER — FLUTICASONE PROPIONATE 50 MCG/ACT NA SUSP
2.0000 | Freq: Every day | NASAL | 0 refills | Status: AC
Start: 2022-03-04 — End: ?

## 2022-03-04 MED ORDER — PROMETHAZINE-DM 6.25-15 MG/5ML PO SYRP: 5.0000 mL | ORAL_SOLUTION | Freq: Four times a day (QID) | ORAL | 0 refills | Status: DC | PRN

## 2022-03-04 NOTE — Discharge Instructions (Addendum)
Take medication as prescribed. ?You may repeat your home COVID test in 2 days. ?Increase fluids and get plenty of rest. ?May take ibuprofen or Tylenol for pain, fever, or general discomfort. ?For continued nasal congestion, you may use normal saline nasal spray. ?Follow-up if symptoms do not improve. ? ?

## 2022-03-04 NOTE — ED Triage Notes (Signed)
Pt reports fever 101.5 F since this morning;  cough and nasal congestion x 4 days. NyQuil gives some relief.  ?

## 2022-03-04 NOTE — ED Provider Notes (Signed)
?[facility] ? ? ? ?CSN: [id number] ?Arrival date & time: 03/04/22  1529 ? ? ?  ? ?History   ?Chief Complaint ?Chief Complaint  ?Patient presents with  ? Fever  ? Cough  ? Nasal Congestion  ? ? ?HPI ?Laura Morales is a 46 y.o. female.  ? ?The patient is a 46 year old female who presents with upper respiratory symptoms.  Symptoms started approximately 4 days ago.  Patient states her husband was recently diagnosed with a upper respiratory infection.  She complains of cough, nasal congestion, runny nose, and sore throat.  She states this morning she woke up with a fever of 101.5.  She states that she did not take any medication, but got back into bed and rested.  She currently has no fever at this time.  She denies history of asthma or seasonal allergies.  She has not taken any medication for her symptoms.  Patient states she took a COVID test this morning which was negative.  ? ?The history is provided by the patient.  ? ?Past Medical History:  ?Diagnosis Date  ? ASCUS of cervix with negative high risk HPV 10/16/2021  ? 10/16/21 repeat pap in 1 year, per [facility], 5 year risk for CIN 3+ is 2.6%  ? Corneal abrasion   ? Endometriosis   ? Insulin resistance   ? Other and unspecified ovarian cysts   ? Papanicolaou smear of cervix with positive high risk human papilloma virus (HPV) test 11/09/2018  ? PCOS (polycystic ovarian syndrome)   ? Retinal hemorrhage   ? SVT (supraventricular tachycardia) (HCC)   ? Vaginal Pap smear, abnormal   ? WPW (Wolff-Parkinson-White syndrome) 2000  ? ? ?Patient Active Problem List  ? Diagnosis Date Noted  ? Tired 02/05/2022  ? Vaginal pain 11/01/2021  ? ASCUS of cervix with negative high risk HPV 10/16/2021  ? Menstrual cramp 10/12/2021  ? Postcoital bleeding 10/12/2021  ? Irregular bleeding 10/12/2021  ? Routine Papanicolaou smear 10/12/2021  ? Pregnancy examination or test, negative result 10/12/2021  ? Nabothian cyst 10/12/2021  ? Screening mammogram for breast cancer 10/12/2021   ? Palpitations 01/13/2020  ? 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 Chest pain 10/12/2019  ? Papanicolaou smear of cervix with positive high risk human papilloma virus (HPV) test 11/09/2018  ? History of endometriosis 10/30/2018  ? Dysmenorrhea 10/30/2018  ? Depression 10/30/2018  ? Screening for colorectal cancer 10/30/2018  ? Encounter for gynecological examination with Papanicolaou smear of cervix 10/30/2018  ? Dyspareunia in female 10/30/2018  ? History of nipple discharge 10/30/2018  ? Pelvic pain 10/30/2018  ? ? ?Past Surgical History:  ?Procedure Laterality Date  ? COLPOSCOPY    ? DILATION AND CURETTAGE OF UTERUS    ? KNEE ARTHROSCOPY    ? LAPAROSCOPY    ? RETINAL DETACHMENT SURGERY    ? TONSILLECTOMY    ? ? ?OB History   ? ? Gravida  ?5  ? Para  ?0  ? Term  ?   ? Preterm  ?   ? AB  ?5  ? Living  ?   ?  ? ? SAB  ?1  ? IAB  ?   ? Ectopic  ?   ? Multiple  ?   ? Live Births  ?   ?   ?  ?  ? ? ? ?Home Medications   ? ?Prior to Admission medications   ?Medication Sig Start Date End Date Taking? Authorizing Provider  ?fluticasone (FLONASE) 50 MCG/ACT  nasal spray Place 2 sprays into both nostrils daily. 03/04/22  Yes Ethlyn Alto-Warren, Sadie Haberhristie J, NP  ?promethazine-dextromethorphan (PROMETHAZINE-DM) 6.25-15 MG/5ML syrup Take 5 mLs by mouth 4 (four) times daily as needed for cough. 03/04/22  Yes Awanda Wilcock-Warren, Sadie Haberhristie J, NP  ?Pseudoeph-Doxylamine-DM-APAP (NYQUIL PO) Take by mouth.   Yes [provider]  ?Multiple Vitamin (MULTIVITAMIN) tablet Take by mouth daily. 2 gummies daily    [provider]  ? ? ?Family History ?Family History  ?Problem Relation Age of Onset  ? Hypotension Mother   ? Epilepsy Mother   ? Cancer Mother   ?     lung  ? Diabetes Father   ? Glaucoma Father   ? Multiple sclerosis Sister   ? Glaucoma Brother   ? Heart failure Maternal Grandmother   ? Cystic fibrosis Sister   ? Seizures Sister   ? Seizures Sister   ? ? ?Social History ?Social History  ? ?Tobacco Use  ? Smoking  status: Every Day  ?  Packs/day: 0.50  ?  Years: 30.00  ?  Pack years: 15.00  ?  Types: Cigarettes  ?  Start date: 11/07/1989  ? Smokeless tobacco: Former  ?  Types: Chew  ?Vaping Use  ? Vaping Use: Never used  ?Substance Use Topics  ? Alcohol use: Yes  ?  Alcohol/week: 3.0 standard drinks  ?  Types: 3 Cans of beer per week  ?  Comment: rarely  ? Drug use: No  ? ? ? ?Allergies   ?Codeine ? ? ?Review of Systems ?Review of Systems  ?Constitutional:  Positive for activity change, appetite change, fatigue and fever.  ?HENT:  Positive for congestion, postnasal drip, rhinorrhea and sore throat. Negative for ear pain.   ?Eyes: Negative.   ?Respiratory:  Positive for cough.   ?Cardiovascular: Negative.   ?Gastrointestinal: Negative.   ?Skin: Negative.   ?Psychiatric/Behavioral: Negative.    ? ? ?Physical Exam ?Triage Vital Signs ?ED Triage Vitals  ?Enc Vitals Group  ?   BP 03/04/22 1705 119/70  ?   Pulse Rate 03/04/22 1705 90  ?   Resp 03/04/22 1705 18  ?   Temp 03/04/22 1705 98.5 ?F (36.9 ?C)  ?   Temp Source 03/04/22 1705 Oral  ?   SpO2 03/04/22 1705 97 %  ?   Weight --   ?   Height --   ?   Head Circumference --   ?   Peak Flow --   ?   Pain Score 03/04/22 1707 0  ?   Pain Loc --   ?   Pain Edu? --   ?   Excl. in GC? --   ? ?No data found. ? ?Updated Vital Signs ?BP 119/70 (BP Location: Right Arm)   Pulse 90   Temp 98.5 ?F (36.9 ?C) (Oral)   Resp 18   LMP  (Within Weeks) Comment: 3 weeks  SpO2 97%  ? ?Visual Acuity ?Right Eye Distance:   ?Left Eye Distance:   ?Bilateral Distance:   ? ?Right Eye Near:   ?Left Eye Near:    ?Bilateral Near:    ? ?Physical Exam ?Vitals reviewed.  ?Constitutional:   ?   Appearance: Normal appearance.  ?HENT:  ?   Head: Normocephalic and atraumatic.  ?   Right Ear: Tympanic membrane, ear canal and external ear normal.  ?   Left Ear: Tympanic membrane, ear canal and external ear normal.  ?   Nose: Congestion and rhinorrhea present.  ?  Mouth/Throat:  ?   Mouth: Mucous membranes are moist.   ?   Pharynx: No oropharyngeal exudate or posterior oropharyngeal erythema.  ?Eyes:  ?   Extraocular Movements: Extraocular movements intact.  ?   Conjunctiva/sclera: Conjunctivae normal.  ?   Pupils: Pupils are equal, round, and reactive to light.  ?Cardiovascular:  ?   Rate and Rhythm: Normal rate and regular rhythm.  ?   Pulses: Normal pulses.  ?   Heart sounds: Normal heart sounds.  ?Pulmonary:  ?   Effort: Pulmonary effort is normal.  ?   Breath sounds: Normal breath sounds.  ?Abdominal:  ?   General: Bowel sounds are normal.  ?   Palpations: Abdomen is soft.  ?   Tenderness: There is no abdominal tenderness.  ?Musculoskeletal:  ?   Cervical back: Normal range of motion and neck supple.  ?Skin: ?   General: Skin is warm and dry.  ?Neurological:  ?   General: No focal deficit present.  ?   Mental Status: She is alert and oriented to person, place, and time.  ?Psychiatric:     ?   Mood and Affect: Mood normal.     ?   Behavior: Behavior normal.  ? ? ? ?UC Treatments / Results  ?Labs ?(all labs ordered are listed, but only abnormal results are displayed) ?Labs Reviewed - No data to display ? ?EKG ? ? ?Radiology ?No results found. ? ?Procedures ?Procedures (including critical care time) ? ?Medications Ordered in UC ?Medications - No data to display ? ?Initial Impression / Assessment and Plan / UC Course  ?I have reviewed the triage vital signs and the nursing notes. ? ?Pertinent labs & imaging results that were available during my care of the patient were reviewed by me and considered in my medical decision making (see chart for details). ? ?Patient is a 46 year old female who presents with upper respiratory symptoms.  Symptoms started about 3 to 4 days ago.  Today she woke up with a fever of 101.5.  Fever has resolved without any antipyretic.  Patient continues to have cough, nasal congestion.  Her vital signs are stable at this time, on exam, her lungs are clear, she does have moderate congestion and runny nose.   Symptoms are consistent with a viral infection at this time based on the duration of symptoms and her current presentation.  We will start the patient on symptomatic treatment to include fluticasone nasal spra

## 2022-03-08 ENCOUNTER — Ambulatory Visit: Payer: BC Managed Care – PPO | Admitting: Adult Health

## 2022-09-24 ENCOUNTER — Ambulatory Visit
Admission: EM | Admit: 2022-09-24 | Discharge: 2022-09-24 | Disposition: A | Discharge status: Home / Self Care | Payer: BC Managed Care – PPO | Attending: Family Medicine | Admitting: Family Medicine

## 2022-09-24 DIAGNOSIS — R059 Cough, unspecified: Secondary | ICD-10-CM | POA: Insufficient documentation

## 2022-09-24 DIAGNOSIS — Z1152 Encounter for screening for COVID-19: Secondary | ICD-10-CM | POA: Insufficient documentation

## 2022-09-24 DIAGNOSIS — J069 Acute upper respiratory infection, unspecified: Secondary | ICD-10-CM | POA: Insufficient documentation

## 2022-09-24 LAB — POCT RAPID STREP A (OFFICE): Rapid Strep A Screen: NEGATIVE

## 2022-09-24 LAB — RESP PANEL BY RT-PCR (FLU A&B, COVID) ARPGX2
Influenza A by PCR: NEGATIVE
Influenza B by PCR: NEGATIVE
SARS Coronavirus 2 by RT PCR: NEGATIVE

## 2022-09-24 MED ORDER — PROMETHAZINE-DM 6.25-15 MG/5ML PO SYRP: 5.0000 mL | ORAL_SOLUTION | Freq: Four times a day (QID) | ORAL | 0 refills | Status: DC | PRN

## 2022-09-24 MED ORDER — AZELASTINE HCL 0.1 % NA SOLN
1.0000 | Freq: Two times a day (BID) | NASAL | 0 refills | Status: AC
Start: 2022-09-24 — End: ?

## 2022-09-24 NOTE — ED Provider Notes (Signed)
[facility]    CSN: [id number] Arrival date & time: 09/24/22  1044      History   Chief Complaint Chief Complaint  Patient presents with   Cough   Fever    HPI Laura Morales is a 46 y.o. female.   Presenting today with 4-day history of sneezing, sore throat, cough, nasal congestion, fatigue, chills, aches.  Denies chest pain, shortness of breath, abdominal pain, nausea vomiting or diarrhea.  So far not trying anything over-the-counter for symptoms.  No known sick contacts recently.    Past Medical History:  Diagnosis Date   ASCUS of cervix with negative high risk HPV 10/16/2021   10/16/21 repeat pap in 1 year, per [facility], 5 year risk for CIN 3+ is 2.6%   Corneal abrasion    Endometriosis    Insulin resistance    Other and unspecified ovarian cysts    Papanicolaou smear of cervix with positive high risk human papilloma virus (HPV) test 11/09/2018   PCOS (polycystic ovarian syndrome)    Retinal hemorrhage    SVT (supraventricular tachycardia)    Vaginal Pap smear, abnormal    WPW (Wolff-Parkinson-White syndrome) 2000    Patient Active Problem List   Diagnosis Date Noted   Tired 02/05/2022   Vaginal pain 11/01/2021   ASCUS of cervix with negative high risk HPV 10/16/2021   Menstrual cramp 10/12/2021   Postcoital bleeding 10/12/2021   Irregular bleeding 10/12/2021   Routine Papanicolaou smear 10/12/2021   Pregnancy examination or test, negative result 10/12/2021   Nabothian cyst 10/12/2021   Screening mammogram for breast cancer 10/12/2021   Palpitations 01/13/2020   Erythema migrans (Lyme disease) 10/12/2019   Chest pain 10/12/2019   Papanicolaou smear of cervix with positive high risk human papilloma virus (HPV) test 11/09/2018   History of endometriosis 10/30/2018   Dysmenorrhea 10/30/2018   Depression 10/30/2018   Screening for colorectal cancer 10/30/2018   Encounter for gynecological examination with Papanicolaou smear of cervix 10/30/2018    Dyspareunia in female 10/30/2018   History of nipple discharge 10/30/2018   Pelvic pain 10/30/2018    Past Surgical History:  Procedure Laterality Date   COLPOSCOPY     DILATION AND CURETTAGE OF UTERUS     KNEE ARTHROSCOPY     LAPAROSCOPY     RETINAL DETACHMENT SURGERY     TONSILLECTOMY      OB History     Gravida  5   Para  0   Term      Preterm      AB  5   Living         SAB  1   IAB      Ectopic      Multiple      Live Births               Home Medications    Prior to Admission medications   Medication Sig Start Date End Date Taking? Authorizing Provider  azelastine (ASTELIN) 0.1 % nasal spray Place 1 spray into both nostrils 2 (two) times daily. Use in each nostril as directed 09/24/22  Yes Particia Nearing, PA-C  Chromium 200 MCG TABS Take by mouth.   Yes [provider]  fluticasone (FLONASE) 50 MCG/ACT nasal spray Place 2 sprays into both nostrils daily. 03/04/22   Leath-Warren, Sadie Haber, NP  Multiple Vitamin (MULTIVITAMIN) tablet Take by mouth daily. 2 gummies daily    [provider]  promethazine-dextromethorphan (PROMETHAZINE-DM) 6.25-15 MG/5ML syrup Take  5 mLs by mouth 4 (four) times daily as needed for cough. 09/24/22   Particia NearingLane, Catherene Kaleta Elizabeth, PA-C  Pseudoeph-Doxylamine-DM-APAP (NYQUIL PO) Take by mouth.    [provider]    Family History Family History  Problem Relation Age of Onset   Hypotension Mother    Epilepsy Mother    Cancer Mother        lung   Diabetes Father    Glaucoma Father    Multiple sclerosis Sister    Glaucoma Brother    Heart failure Maternal Grandmother    Cystic fibrosis Sister    Seizures Sister    Seizures Sister     Social History Social History   Tobacco Use   Smoking status: Every Day    Packs/day: 0.50    Years: 30.00    Total pack years: 15.00    Types: Cigarettes    Start date: 11/07/1989   Smokeless tobacco: Former    Types: Mining engineerChew  Vaping Use    Vaping Use: Never used  Substance Use Topics   Alcohol use: Yes    Alcohol/week: 3.0 standard drinks of alcohol    Types: 3 Cans of beer per week    Comment: rarely   Drug use: No     Allergies   Codeine   Review of Systems Review of Systems Per HPI  Physical Exam Triage Vital Signs ED Triage Vitals  Enc Vitals Group     BP 09/24/22 1243 119/74     Pulse Rate 09/24/22 1243 74     Resp 09/24/22 1243 16     Temp 09/24/22 1243 98.3 F (36.8 C)     Temp Source 09/24/22 1243 Oral     SpO2 09/24/22 1243 98 %     Weight --      Height --      Head Circumference --      Peak Flow --      Pain Score 09/24/22 1241 6     Pain Loc --      Pain Edu? --      Excl. in [place]? --    No data found.  Updated Vital Signs BP 119/74 (BP Location: Right Arm)   Pulse 74   Temp 98.3 F (36.8 C) (Oral)   Resp 16   LMP  (Within Weeks) Comment: 2 weeks  SpO2 98%   Visual Acuity Right Eye Distance:   Left Eye Distance:   Bilateral Distance:    Right Eye Near:   Left Eye Near:    Bilateral Near:     Physical Exam Vitals and nursing note reviewed.  Constitutional:      Appearance: Normal appearance.  HENT:     Head: Atraumatic.     Right Ear: Tympanic membrane and external ear normal.     Left Ear: Tympanic membrane and external ear normal.     Nose: Rhinorrhea present.     Mouth/Throat:     Mouth: Mucous membranes are moist.     Pharynx: Posterior oropharyngeal erythema present.  Eyes:     Extraocular Movements: Extraocular movements intact.     Conjunctiva/sclera: Conjunctivae normal.  Cardiovascular:     Rate and Rhythm: Normal rate and regular rhythm.     Heart sounds: Normal heart sounds.  Pulmonary:     Effort: Pulmonary effort is normal.     Breath sounds: Normal breath sounds. No wheezing.  Musculoskeletal:        General: Normal range of motion.  Cervical back: Normal range of motion and neck supple.  Skin:    General: Skin is warm and dry.  Neurological:      Mental Status: She is alert and oriented to person, place, and time.     Motor: No weakness.     Gait: Gait normal.  Psychiatric:        Mood and Affect: Mood normal.        Thought Content: Thought content normal.      UC Treatments / Results  Labs (all labs ordered are listed, but only abnormal results are displayed) Labs Reviewed  RESP PANEL BY RT-PCR (FLU A&B, COVID) [id number]  POCT RAPID STREP A (OFFICE)    EKG   Radiology No results found.  Procedures Procedures (including critical care time)  Medications Ordered in UC Medications - No data to display  Initial Impression / Assessment and Plan / UC Course  I have reviewed the triage vital signs and the nursing notes.  Pertinent labs & imaging results that were available during my care of the patient were reviewed by me and considered in my medical decision making (see chart for details).     Vitals and exam reassuring today and suggestive of a viral upper respiratory infection.  Rapid strep negative, respiratory panel pending.  Treat with Phenergan DM, Astelin nasal spray, supportive over-the-counter medications and home care.  Return for worsening symptoms.  Work note given.  Final Clinical Impressions(s) / UC Diagnoses   Final diagnoses:  Viral URI with cough     Discharge Instructions      You may take over-the-counter cold congestion medications such as Coricidin HBP, which is safe for people with heart conditions, plain Mucinex, Delsym    ED Prescriptions     Medication Sig Dispense Auth. Provider   promethazine-dextromethorphan (PROMETHAZINE-DM) 6.25-15 MG/5ML syrup Take 5 mLs by mouth 4 (four) times daily as needed for cough. 140 mL Volney American, PA-C   azelastine (ASTELIN) 0.1 % nasal spray Place 1 spray into both nostrils 2 (two) times daily. Use in each nostril as directed 30 mL Volney American, PA-C      PDMP not reviewed this encounter.   Volney American,  [place] 09/24/22 1316

## 2022-09-24 NOTE — ED Triage Notes (Signed)
Pt reports nasal congestion, sneezing, sore throat and cough x 4 days.

## 2022-09-24 NOTE — Discharge Instructions (Signed)
You may take over-the-counter cold congestion medications such as Coricidin HBP, which is safe for people with heart conditions, plain Mucinex, Delsym

## 2022-12-20 ENCOUNTER — Ambulatory Visit
Admission: EM | Admit: 2022-12-20 | Discharge: 2022-12-20 | Disposition: A | Discharge status: Home / Self Care | Payer: BC Managed Care – PPO | Attending: Nurse Practitioner | Admitting: Nurse Practitioner

## 2022-12-20 DIAGNOSIS — R062 Wheezing: Secondary | ICD-10-CM

## 2022-12-20 DIAGNOSIS — R051 Acute cough: Secondary | ICD-10-CM

## 2022-12-20 MED ORDER — AMOXICILLIN 500 MG PO CAPS: 500.0000 mg | ORAL_CAPSULE | Freq: Three times a day (TID) | ORAL | 0 refills | Status: AC

## 2022-12-20 MED ORDER — BENZONATATE 100 MG PO CAPS: 100.0000 mg | ORAL_CAPSULE | Freq: Three times a day (TID) | ORAL | 0 refills | Status: AC | PRN

## 2022-12-20 MED ORDER — ALBUTEROL SULFATE HFA 108 (90 BASE) MCG/ACT IN AERS: 1.0000 | INHALATION_SPRAY | Freq: Four times a day (QID) | RESPIRATORY_TRACT | 0 refills | Status: AC | PRN

## 2022-12-20 NOTE — ED Triage Notes (Signed)
Pt reports with a cough, sore throat, fever, sweats, chills, body aches, lots of green mucus foggy mind, bits of confusion x 5 days  Just had dental surgery and took amoxicillin

## 2022-12-20 NOTE — ED Provider Notes (Addendum)
[facility]    CSN: [id number] Arrival date & time: 12/20/22  1008      History   Chief Complaint No chief complaint on file.   HPI Laura Morales is a 47 y.o. female.   HPI She is complaining of productive cough with green mucus, sore throat, chills, body aches. This has been going on for 5 days. She has recently been treated with amoxicillin for dental procedure. She endorses confusion. Exposure negative COVID/Influenza/Strep.  Denies fever,  nasal congestion, sneezing, runny nose, , new loss of smell or taste, shortness of breath, chest pain, nausea, or diarrhea. She denies any dysuria or polyuria.  The current treatment has been amox and IBU  Past Medical History:  Diagnosis Date   ASCUS of cervix with negative high risk HPV 10/16/2021   10/16/21 repeat pap in 1 year, per [facility], 5 year risk for CIN 3+ is 2.6%   Corneal abrasion    Endometriosis    Insulin resistance    Other and unspecified ovarian cysts    Papanicolaou smear of cervix with positive high risk human papilloma virus (HPV) test 11/09/2018   PCOS (polycystic ovarian syndrome)    Retinal hemorrhage    SVT (supraventricular tachycardia)    Vaginal Pap smear, abnormal    WPW (Wolff-Parkinson-White syndrome) 2000    Patient Active Problem List   Diagnosis Date Noted   Tired 02/05/2022   Vaginal pain 11/01/2021   ASCUS of cervix with negative high risk HPV 10/16/2021   Menstrual cramp 10/12/2021   Postcoital bleeding 10/12/2021   Irregular bleeding 10/12/2021   Routine Papanicolaou smear 10/12/2021   Pregnancy examination or test, negative result 10/12/2021   Nabothian cyst 10/12/2021   Screening mammogram for breast cancer 10/12/2021   Palpitations 01/13/2020   Erythema migrans (Lyme disease) 10/12/2019   Chest pain 10/12/2019   Papanicolaou smear of cervix with positive high risk human papilloma virus (HPV) test 11/09/2018   History of endometriosis 10/30/2018   Dysmenorrhea  10/30/2018   Depression 10/30/2018   Screening for colorectal cancer 10/30/2018   Encounter for gynecological examination with Papanicolaou smear of cervix 10/30/2018   Dyspareunia in female 10/30/2018   History of nipple discharge 10/30/2018   Pelvic pain 10/30/2018    Past Surgical History:  Procedure Laterality Date   COLPOSCOPY     DILATION AND CURETTAGE OF UTERUS     KNEE ARTHROSCOPY     LAPAROSCOPY     RETINAL DETACHMENT SURGERY     TONSILLECTOMY      OB History     Gravida  5   Para  0   Term      Preterm      AB  5   Living         SAB  1   IAB      Ectopic      Multiple      Live Births               Home Medications    Prior to Admission medications   Medication Sig Start Date End Date Taking? Authorizing Provider  albuterol (VENTOLIN HFA) 108 (90 Base) MCG/ACT inhaler Inhale 1-2 puffs into the lungs every 6 (six) hours as needed for wheezing or shortness of breath. 12/20/22  Yes Vevelyn Francois, NP  azelastine (ASTELIN) 0.1 % nasal spray Place 1 spray into both nostrils 2 (two) times daily. Use in each nostril as directed 09/24/22   Volney American, PA-C  Chromium 200 MCG TABS Take by mouth.    [provider]  fluticasone (FLONASE) 50 MCG/ACT nasal spray Place 2 sprays into both nostrils daily. 03/04/22   Leath-Warren, Alda Lea, NP  Multiple Vitamin (MULTIVITAMIN) tablet Take by mouth daily. 2 gummies daily    [provider]    Family History Family History  Problem Relation Age of Onset   Hypotension Mother    Epilepsy Mother    Cancer Mother        lung   Diabetes Father    Glaucoma Father    Multiple sclerosis Sister    Glaucoma Brother    Heart failure Maternal Grandmother    Cystic fibrosis Sister    Seizures Sister    Seizures Sister     Social History Social History   Tobacco Use   Smoking status: Every Day    Packs/day: 0.50    Years: 30.00    Total pack years: 15.00    Types:  Cigarettes    Start date: 11/07/1989   Smokeless tobacco: Former    Types: Nurse, children's Use: Never used  Substance Use Topics   Alcohol use: Yes    Alcohol/week: 3.0 standard drinks of alcohol    Types: 3 Cans of beer per week    Comment: rarely   Drug use: No     Allergies   Codeine   Review of Systems Review of Systems   Physical Exam Triage Vital Signs ED Triage Vitals  Enc Vitals Group     BP 12/20/22 1045 105/70     Pulse Rate 12/20/22 1045 77     Resp 12/20/22 1045 20     Temp 12/20/22 1045 98 F (36.7 C)     Temp Source 12/20/22 1045 Oral     SpO2 12/20/22 1045 98 %     Weight --      Height --      Head Circumference --      Peak Flow --      Pain Score 12/20/22 1046 6     Pain Loc --      Pain Edu? --      Excl. in [place]? --    No data found.  Updated Vital Signs BP 105/70 (BP Location: Right Arm)   Pulse 77   Temp 98 F (36.7 C) (Oral)   Resp 20   LMP 12/17/2022 (Exact Date)   SpO2 98%   Visual Acuity Right Eye Distance:   Left Eye Distance:   Bilateral Distance:    Right Eye Near:   Left Eye Near:    Bilateral Near:     Physical Exam Constitutional:      Appearance: She is normal weight.     Comments: cough  HENT:     Head: Normocephalic and atraumatic.     Right Ear: Tympanic membrane normal.     Left Ear: Tympanic membrane normal.     Nose: Nose normal.     Mouth/Throat:     Comments: Sutures visible lower plate no teeth Eyes:     Pupils: Pupils are equal, round, and reactive to light.  Cardiovascular:     Rate and Rhythm: Normal rate and regular rhythm.     Pulses: Normal pulses.     Heart sounds: Normal heart sounds.  Pulmonary:     Breath sounds: Wheezing present.  Musculoskeletal:        General: Normal range of motion.  Cervical back: Normal range of motion.  Skin:    General: Skin is warm and dry.     Capillary Refill: Capillary refill takes less than 2 seconds.  Neurological:     General: No  focal deficit present.     Mental Status: She is alert.  Psychiatric:        Mood and Affect: Mood normal.      UC Treatments / Results  Labs (all labs ordered are listed, but only abnormal results are displayed) Labs Reviewed - No data to display  EKG   Radiology No results found.  Procedures Procedures (including critical care time)  Medications Ordered in UC Medications - No data to display  Initial Impression / Assessment and Plan / UC Course  I have reviewed the triage vital signs and the nursing notes.  Pertinent labs & imaging results that were available during my care of the patient were reviewed by me and considered in my medical decision making (see chart for details).     cough Final Clinical Impressions(s) / UC Diagnoses   Final diagnoses:  Wheezing     Discharge Instructions      You have declined testing. You have wheezing and cough with some congestion. You have been prescribed albuterol inhaler for the wheezing,  benzonatate 100 mg every 8 hours for the cough. The amoxicillin has been extended for 7 days.  Recommend smoking cessation.  We encourage conservative treatment with symptom relief. We encourage you to use Tylenol alternating with Ibuprofen for your fever if not contraindicated. (Remember to use as directed do not exceed daily dosing recommendations) We also encourage salt water gargles for your sore throat. You should also consider throat lozenges and chloraseptic spray.  Your cough can be soothed with a cough suppressant.      ED Prescriptions     Medication Sig Dispense Auth. Provider   albuterol (VENTOLIN HFA) 108 (90 Base) MCG/ACT inhaler Inhale 1-2 puffs into the lungs every 6 (six) hours as needed for wheezing or shortness of breath. 8 g Vevelyn Francois, NP   benzonatate (TESSALON) 100 MG capsule Take 1 capsule (100 mg total) by mouth 3 (three) times daily as needed for up to 10 days for cough. Never suck or chew on a  benzonatate capsule. 30 capsule Dionisio David M, NP   amoxicillin (AMOXIL) 500 MG capsule Take 1 capsule (500 mg total) by mouth 3 (three) times daily for 7 days. 21 capsule Vevelyn Francois, NP      PDMP not reviewed this encounter.   Vevelyn Francois, NP 12/20/22 1139    Vevelyn Francois, NP 12/27/22 0946    Vevelyn Francois, NP 01/10/23 1144

## 2022-12-20 NOTE — Discharge Instructions (Addendum)
You have declined testing. You have wheezing and cough with some congestion. You have been prescribed albuterol inhaler for the wheezing,  benzonatate 100 mg every 8 hours for the cough. The amoxicillin has been extended for 7 days.  Recommend smoking cessation.  We encourage conservative treatment with symptom relief. We encourage you to use Tylenol alternating with Ibuprofen for your fever if not contraindicated. (Remember to use as directed do not exceed daily dosing recommendations) We also encourage salt water gargles for your sore throat. You should also consider throat lozenges and chloraseptic spray.  Your cough can be soothed with a cough suppressant.

## 2023-08-30 IMAGING — MG MM DIGITAL DIAGNOSTIC UNILAT*R* W/ TOMO W/ CAD
4 series · 4 of 8 positions shown · non-contrast
Comparison: Baseline screening mammogram dated 10/19/2021.

CLINICAL DATA: Screening recall for right breast calcifications.

EXAM:
DIGITAL DIAGNOSTIC UNILATERAL RIGHT MAMMOGRAM WITH TOMOSYNTHESIS AND
CAD
TECHNIQUE: Right digital diagnostic mammography and breast tomosynthesis was
performed. The images were evaluated with computer-aided detection.

[R CC]
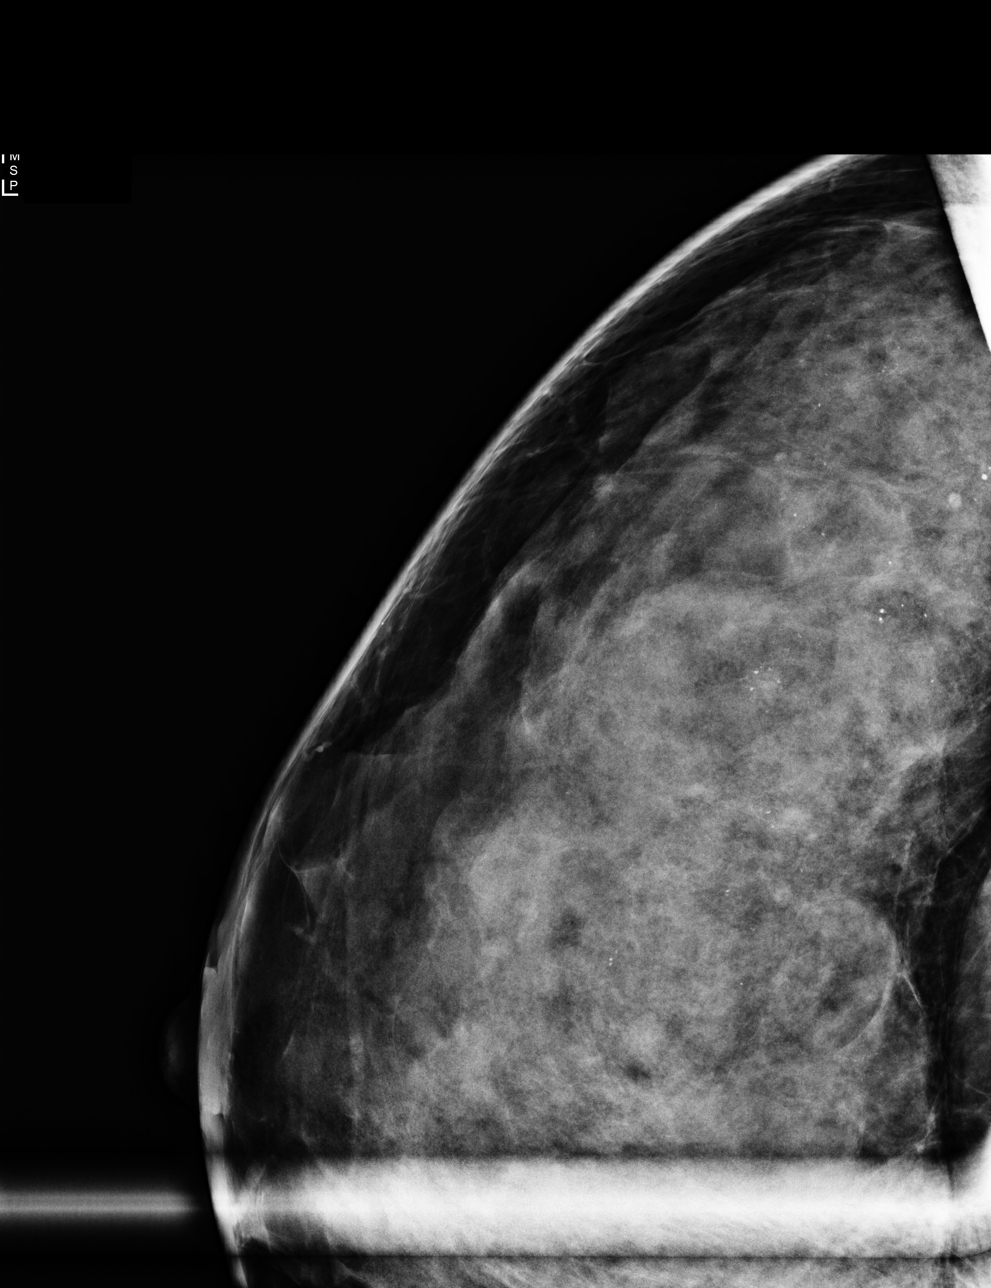

[R ML]
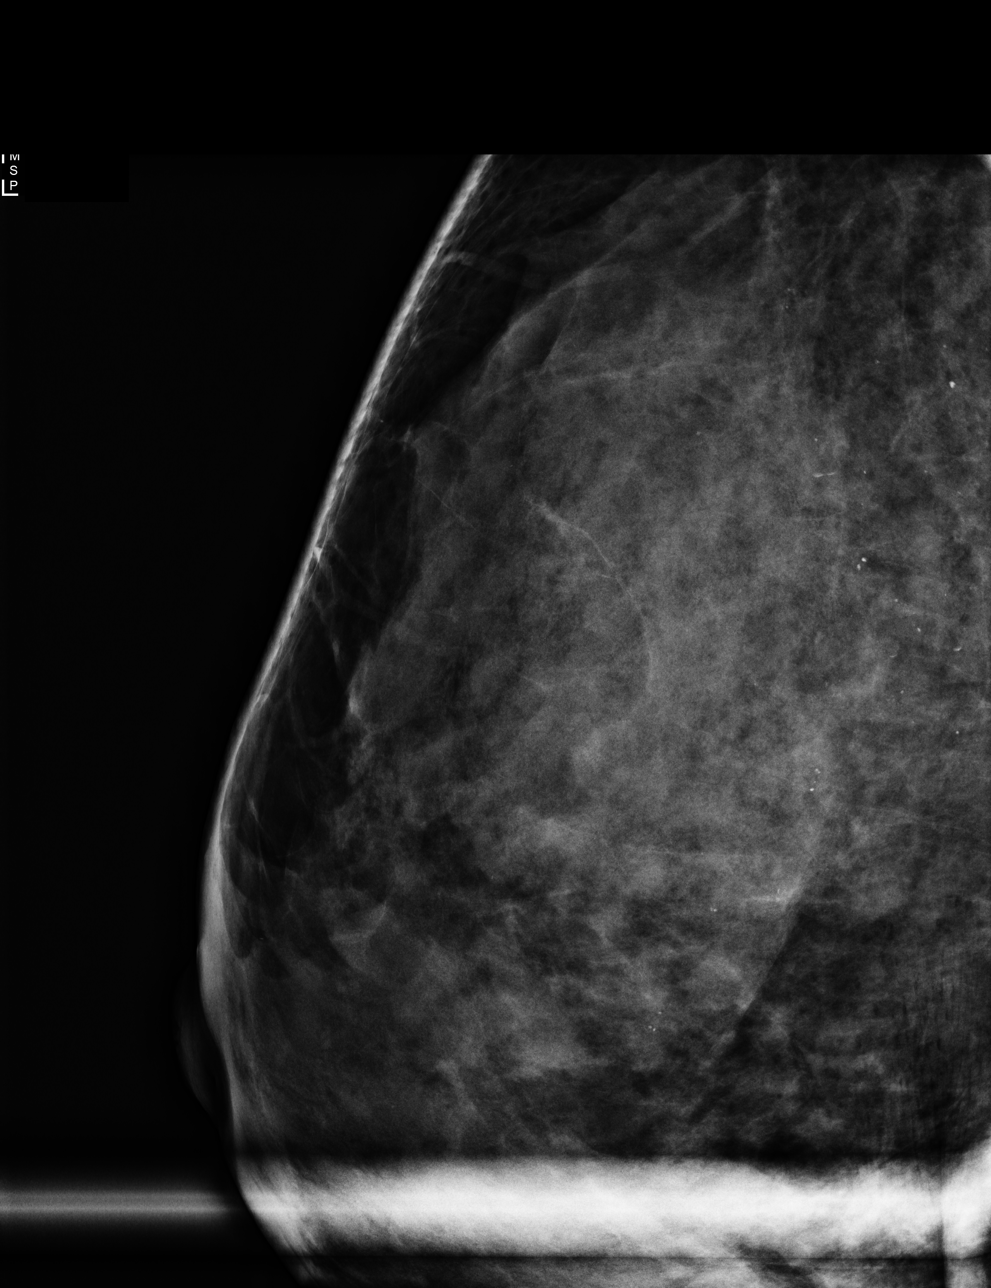

[R ML synth-2D]
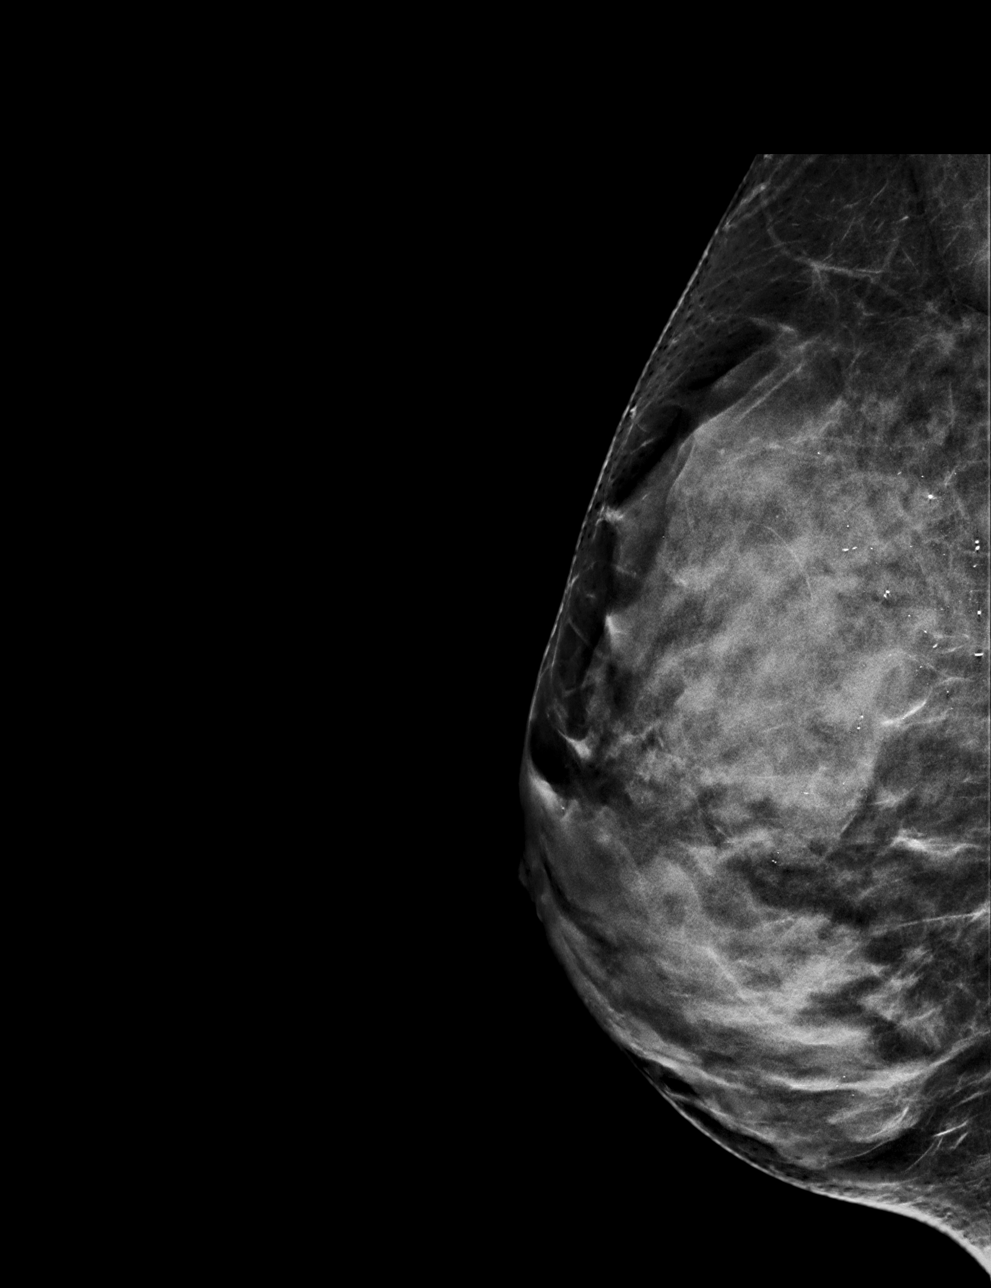

[R ML tomo · tomo slice 33/65.0]
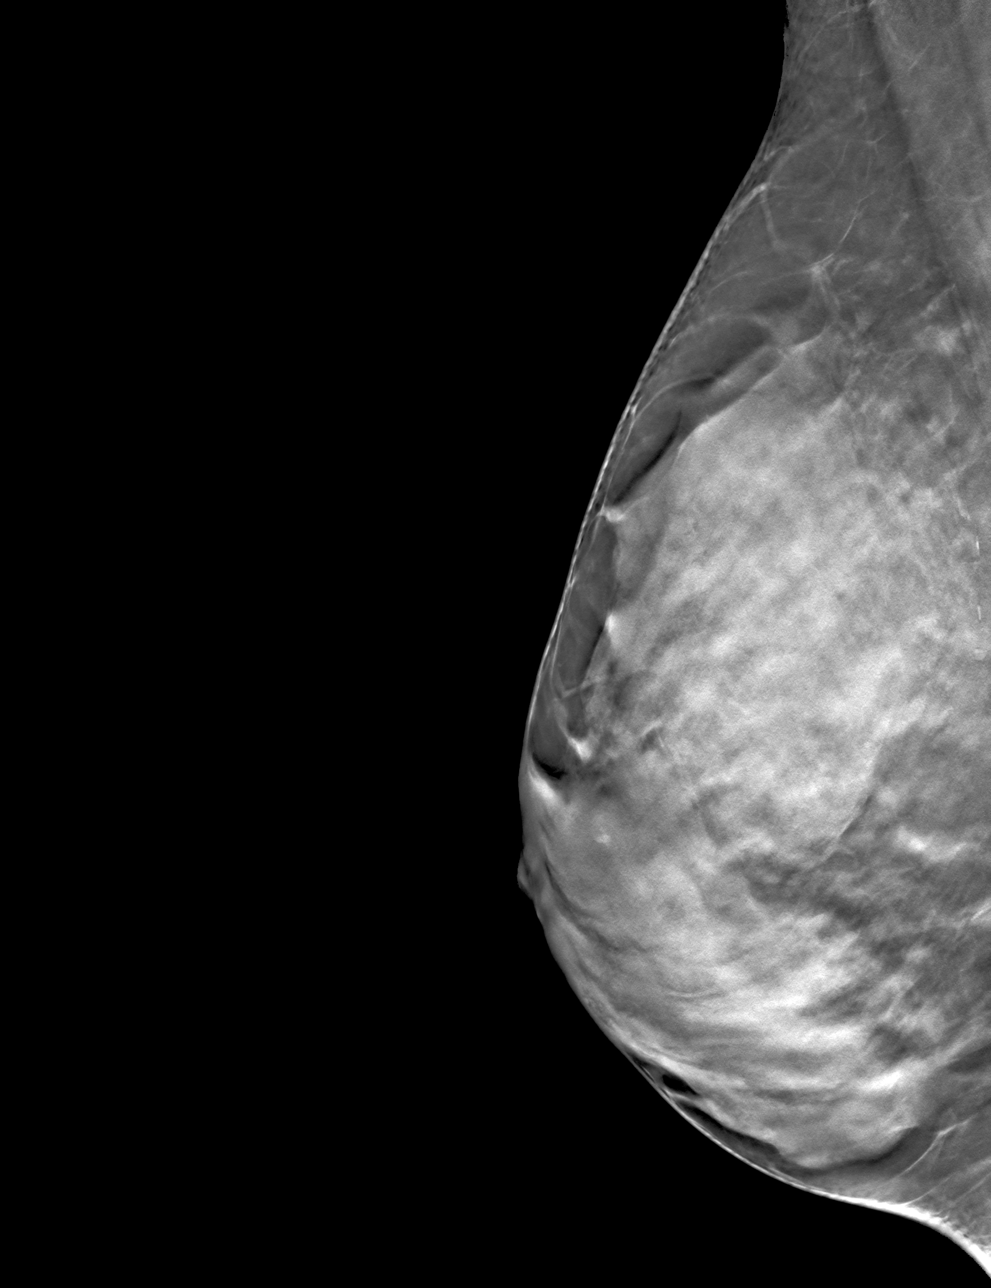

[4 of 8 positions shown; findings below may reference images not displayed]

ACR Breast Density Category d: The breast tissue is extremely dense,
which lowers the sensitivity of mammography.
FINDINGS: Spot compression magnification views were performed over the upper
outer right breast demonstrating loosely grouped and scattered
calcifications, several of which layer on the spot compression
magnification ML view. Findings are most suggestive of benign milk
of calcium.
IMPRESSION: Probably benign right breast calcifications.

RECOMMENDATION:
Recommend six-month follow-up diagnostic mammography of the right
breast with magnification views.

I have discussed the findings and recommendations with the patient.
If applicable, a reminder letter will be sent to the patient
regarding the next appointment.

BI-RADS CATEGORY  3: Probably benign.

## 2024-10-04 ENCOUNTER — Other Ambulatory Visit (HOSPITAL_COMMUNITY): Payer: Self-pay | Admitting: Adult Health

## 2024-10-04 DIAGNOSIS — N6310 Unspecified lump in the right breast, unspecified quadrant: Secondary | ICD-10-CM

## 2024-10-21 ENCOUNTER — Other Ambulatory Visit: Payer: Self-pay | Admitting: Medical Genetics

## 2024-10-28 ENCOUNTER — Ambulatory Visit (HOSPITAL_COMMUNITY): Admission: RE | Admit: 2024-10-28 | Discharge: 2024-10-28 | Attending: Adult Health | Admitting: Adult Health

## 2024-10-28 ENCOUNTER — Ambulatory Visit (HOSPITAL_COMMUNITY)

## 2024-10-28 ENCOUNTER — Inpatient Hospital Stay (HOSPITAL_COMMUNITY): Admission: RE | Admit: 2024-10-28 | Discharge: 2024-10-28 | Attending: Adult Health | Admitting: Adult Health

## 2024-10-28 DIAGNOSIS — N6310 Unspecified lump in the right breast, unspecified quadrant: Secondary | ICD-10-CM | POA: Diagnosis present

## 2024-11-03 ENCOUNTER — Ambulatory Visit: Payer: Self-pay | Admitting: Adult Health

## 2024-11-12 LAB — GENECONNECT MOLECULAR SCREEN: Genetic Analysis Overall Interpretation: NEGATIVE
